# Patient Record
Sex: Male | Born: 1935 | Race: Black or African American | Hispanic: No | Marital: Married | Smoking: Current every day smoker
Health system: Southern US, Community
[De-identification: ages and names within clinical notes are randomized; demographics above are authoritative.]

## PROBLEM LIST (undated history)

## (undated) DIAGNOSIS — R001 Bradycardia, unspecified: Secondary | ICD-10-CM

## (undated) DIAGNOSIS — I5022 Chronic systolic (congestive) heart failure: Secondary | ICD-10-CM

## (undated) DIAGNOSIS — I10 Essential (primary) hypertension: Secondary | ICD-10-CM

## (undated) DIAGNOSIS — E876 Hypokalemia: Secondary | ICD-10-CM

## (undated) DIAGNOSIS — R410 Disorientation, unspecified: Secondary | ICD-10-CM

## (undated) DIAGNOSIS — I351 Nonrheumatic aortic (valve) insufficiency: Secondary | ICD-10-CM

## (undated) DIAGNOSIS — I071 Rheumatic tricuspid insufficiency: Secondary | ICD-10-CM

## (undated) DIAGNOSIS — W19XXXA Unspecified fall, initial encounter: Secondary | ICD-10-CM

## (undated) DIAGNOSIS — I340 Nonrheumatic mitral (valve) insufficiency: Secondary | ICD-10-CM

## (undated) DIAGNOSIS — Z720 Tobacco use: Secondary | ICD-10-CM

## (undated) DIAGNOSIS — I739 Peripheral vascular disease, unspecified: Secondary | ICD-10-CM

## (undated) DIAGNOSIS — R9430 Abnormal result of cardiovascular function study, unspecified: Secondary | ICD-10-CM

## (undated) DIAGNOSIS — I2510 Atherosclerotic heart disease of native coronary artery without angina pectoris: Secondary | ICD-10-CM

## (undated) DIAGNOSIS — R0602 Shortness of breath: Secondary | ICD-10-CM

## (undated) DIAGNOSIS — I4510 Unspecified right bundle-branch block: Secondary | ICD-10-CM

## (undated) DIAGNOSIS — E875 Hyperkalemia: Secondary | ICD-10-CM

## (undated) DIAGNOSIS — J449 Chronic obstructive pulmonary disease, unspecified: Secondary | ICD-10-CM

## (undated) DIAGNOSIS — IMO0002 Reserved for concepts with insufficient information to code with codable children: Secondary | ICD-10-CM

## (undated) DIAGNOSIS — N289 Disorder of kidney and ureter, unspecified: Secondary | ICD-10-CM

## (undated) DIAGNOSIS — R296 Repeated falls: Secondary | ICD-10-CM

## (undated) HISTORY — DX: Chronic systolic (congestive) heart failure: I50.22

## (undated) HISTORY — DX: Shortness of breath: R06.02

## (undated) HISTORY — DX: Hyperkalemia: E87.5

## (undated) HISTORY — DX: Hypokalemia: E87.6

## (undated) HISTORY — DX: Nonrheumatic mitral (valve) insufficiency: I34.0

## (undated) HISTORY — DX: Atherosclerotic heart disease of native coronary artery without angina pectoris: I25.10

## (undated) HISTORY — DX: Essential (primary) hypertension: I10

## (undated) HISTORY — DX: Disorientation, unspecified: R41.0

## (undated) HISTORY — DX: Peripheral vascular disease, unspecified: I73.9

## (undated) HISTORY — DX: Nonrheumatic aortic (valve) insufficiency: I35.1

## (undated) HISTORY — DX: Bradycardia, unspecified: R00.1

## (undated) HISTORY — DX: Unspecified fall, initial encounter: W19.XXXA

## (undated) HISTORY — DX: Disorder of kidney and ureter, unspecified: N28.9

## (undated) HISTORY — DX: Tobacco use: Z72.0

## (undated) HISTORY — DX: Unspecified right bundle-branch block: I45.10

## (undated) HISTORY — DX: Reserved for concepts with insufficient information to code with codable children: IMO0002

## (undated) HISTORY — DX: Rheumatic tricuspid insufficiency: I07.1

## (undated) HISTORY — DX: Chronic obstructive pulmonary disease, unspecified: J44.9

## (undated) HISTORY — DX: Repeated falls: R29.6

## (undated) HISTORY — DX: Abnormal result of cardiovascular function study, unspecified: R94.30

---

## 2006-04-11 ENCOUNTER — Ambulatory Visit: Payer: Self-pay | Admitting: Cardiology

## 2006-04-18 ENCOUNTER — Ambulatory Visit: Payer: Self-pay | Admitting: Cardiology

## 2006-05-10 ENCOUNTER — Ambulatory Visit: Payer: Self-pay | Admitting: Cardiology

## 2011-06-12 DIAGNOSIS — R0789 Other chest pain: Secondary | ICD-10-CM

## 2011-06-13 ENCOUNTER — Inpatient Hospital Stay (HOSPITAL_COMMUNITY)
Admission: AD | Admit: 2011-06-13 | Discharge: 2011-06-14 | DRG: 124 | Disposition: A | Discharge status: Home / Self Care | Payer: BC Managed Care – PPO | Source: Other Acute Inpatient Hospital | Attending: Cardiovascular Disease | Admitting: Cardiovascular Disease

## 2011-06-13 DIAGNOSIS — J4489 Other specified chronic obstructive pulmonary disease: Secondary | ICD-10-CM | POA: Diagnosis present

## 2011-06-13 DIAGNOSIS — Z7982 Long term (current) use of aspirin: Secondary | ICD-10-CM

## 2011-06-13 DIAGNOSIS — F17200 Nicotine dependence, unspecified, uncomplicated: Secondary | ICD-10-CM | POA: Diagnosis present

## 2011-06-13 DIAGNOSIS — I739 Peripheral vascular disease, unspecified: Secondary | ICD-10-CM | POA: Diagnosis present

## 2011-06-13 DIAGNOSIS — I509 Heart failure, unspecified: Secondary | ICD-10-CM | POA: Diagnosis present

## 2011-06-13 DIAGNOSIS — I10 Essential (primary) hypertension: Secondary | ICD-10-CM | POA: Diagnosis present

## 2011-06-13 DIAGNOSIS — I059 Rheumatic mitral valve disease, unspecified: Secondary | ICD-10-CM | POA: Diagnosis present

## 2011-06-13 DIAGNOSIS — J449 Chronic obstructive pulmonary disease, unspecified: Secondary | ICD-10-CM | POA: Diagnosis present

## 2011-06-13 DIAGNOSIS — E876 Hypokalemia: Secondary | ICD-10-CM | POA: Diagnosis present

## 2011-06-13 DIAGNOSIS — I2510 Atherosclerotic heart disease of native coronary artery without angina pectoris: Secondary | ICD-10-CM | POA: Diagnosis present

## 2011-06-13 DIAGNOSIS — I5023 Acute on chronic systolic (congestive) heart failure: Principal | ICD-10-CM | POA: Diagnosis present

## 2011-06-13 DIAGNOSIS — I428 Other cardiomyopathies: Secondary | ICD-10-CM | POA: Diagnosis present

## 2011-06-13 DIAGNOSIS — D649 Anemia, unspecified: Secondary | ICD-10-CM | POA: Diagnosis present

## 2011-06-13 LAB — BASIC METABOLIC PANEL
BUN: 14 mg/dL (ref 6–23)
CO2: 35 mEq/L — ABNORMAL HIGH (ref 19–32)
Calcium: 8.8 mg/dL (ref 8.4–10.5)
Chloride: 99 mEq/L (ref 96–112)
Creatinine, Ser: 0.83 mg/dL (ref 0.50–1.35)
GFR calc Af Amer: >60 mL/min (ref >60)

## 2011-06-14 DIAGNOSIS — I5023 Acute on chronic systolic (congestive) heart failure: Secondary | ICD-10-CM

## 2011-06-14 HISTORY — PX: CARDIAC CATHETERIZATION: SHX172

## 2011-06-14 LAB — BASIC METABOLIC PANEL
BUN: 15 mg/dL (ref 6–23)
Calcium: 8.4 mg/dL (ref 8.4–10.5)
GFR calc non Af Amer: >60 mL/min (ref >60)
Glucose, Bld: 94 mg/dL (ref 70–99)

## 2011-06-14 LAB — CBC
HCT: 33 % — ABNORMAL LOW (ref 39.0–52.0)
Hemoglobin: 11 g/dL — ABNORMAL LOW (ref 13.0–17.0)
MCH: 33.4 pg (ref 26.0–34.0)
MCHC: 33.3 g/dL (ref 30.0–36.0)
RDW: 14 % (ref 11.5–15.5)

## 2011-06-14 LAB — LIPID PANEL: LDL Cholesterol: 66 mg/dL (ref 0–99)

## 2011-06-21 NOTE — Cardiovascular Report (Signed)
  Jonathan Craig, MATERA NO.:  [id number]  MEDICAL RECORD NO.:  [id number]  LOCATION:  2915                         FACILITY:  [facility]  PHYSICIAN:  Noralyn Pick. Eden Emms, MD, FACCDATE OF BIRTH:  05-14-1924  DATE OF PROCEDURE: DATE OF DISCHARGE:                           CARDIAC CATHETERIZATION   INDICATIONS:  An 77 year old patient of Dr. Myrtis Ser, transferred from [facility] for heart failure.  A vague history accompanies the patient appears that he has had a chronic cardiomyopathy with no clearly documented coronary disease.  He had a mild enzyme bump at [facility] with a BMP of 20-22.  Cath was done to assess for coronary artery disease.  Cine catheterization was done from the right femoral artery who initially had difficulty due to severe calcification of his iliac artery as well as his femoral artery.  We were able to pass a 5-French sheath with a Wholey wire.  We tried to use a short J exchange throughout the case due to this.  Left main coronary artery had a 30% eccentric lesion.  Proximal LAD had a 20% eccentric lesion.  Mid and distal LAD were normal.  First diagonal branch had a 60-70% ostial lesion.  Her remainder of the vessel was normal.  The obtuse marginal branch was nondominant and normal.  First obtuse marginal branch was normal.  Right coronary artery was dominant and normal.  RAO ventriculography:  Ventriculography showed global hypokinesis with an EF of 25%.  There was at least mild MR.  Aortic pressure was 140/54, LV pressure was 140/14 with post A-wave EDP of 25.  Due to his decreased LV function, he was given 10 mg of IV Lasix in the lab.  IMPRESSION:  The patient appears to have a nonischemic cardiomyopathy, which by history is apparently chronic.  He is elderly with significant COPD and some asthenia.  Medical management is warranted.  He tolerated the procedure well.     Noralyn Pick. Eden Emms, MD, [facility]     PCN/MEDQ  D:  06/13/2011  T:   06/14/2011  Job:  [id number]  cc:   [facility] Gene Serpe, PA-C Luis Abed, MD, [facility]  Electronically Signed by Charlton Haws MD [facility] on 06/21/2011 12:53:41 PM

## 2011-06-22 DIAGNOSIS — I359 Nonrheumatic aortic valve disorder, unspecified: Secondary | ICD-10-CM

## 2011-07-04 NOTE — Discharge Summary (Signed)
NAMEKODA, ROUTON NO.:  [id number]  MEDICAL RECORD NO.:  [id number]  LOCATION:  2915                         FACILITY:  [facility]  PHYSICIAN:  Noralyn Pick. Eden Emms, MD, FACCDATE OF BIRTH:  07-Aug-1924  DATE OF ADMISSION:  06/13/2011 DATE OF DISCHARGE:  06/14/2011                              DISCHARGE SUMMARY   PROCEDURES: 1. Cardiac catheterization. 2. Coronary arteriogram. 3. Left ventriculogram.  PRIMARY/FINAL DISCHARGE DIAGNOSES:  Acute-on-chronic systolic congestive heart failure.  SECONDARY DIAGNOSES: 1. Nonischemic cardiomyopathy with nonobstructive coronary artery     disease by catheterization this admission. 2. Hypokalemia, status post supplementation. 3. Bilateral femoral bruits and abdominal bruits, felt secondary to     peripheral vascular disease because of difficulty accessing the     right femoral artery and calcium noted vascularly. 4. History of tobacco use. 5. Hypertension. 6. History of anemia with a hemoglobin of 11 and hematocrit of 33 at     discharge.  TIME AT DISCHARGE:  41 minutes.  HOSPITAL COURSE:  Mr. Bures is an 77 year old male with a history of systolic heart failure.  He had shortness of breath and went to [facility].  There, he was seen by Cardiology and an echocardiogram showed left ventricular dysfunction.  He was transferred to [facility] for further evaluation and cardiac catheterization.  Cardiac catheterization showed a 20% LAD, D1 70% ostially, and no other disease.  His EF was 25% with global hypokinesis and at least mild MR. He had a post-A wave EDP of 25, so he was given IV Lasix.  He was also felt to have some COPD and asthma.  Medical therapy was recommended.  He had been started on an ACE inhibitor and nitrates.  Prior to admission, he had been on low-dose Lasix and an ACE inhibitor.  On June 14, 2011, his potassium was slightly low, but other labs were stable. His potassium was supplemented.  A lipid  profile showed total cholesterol 139, triglycerides 115, HDL 50, LDL 66.  He was noted to have bilateral femoral bruits and an abdominal bruit.  Dr. Eden Emms felt that he had peripheral vascular disease because of calcified vessels and difficulty accessing the right femoral artery.  He is not currently having any claudication symptoms and is to follow up as an outpatient. On June 14, 2011, Mr. Goldston was evaluated by Dr. Eden Emms and considered stable for discharge, to follow up as an outpatient.  DISCHARGE INSTRUCTIONS: 1. His activity level is to be increased gradually. 2. He is not to do any lifting for a week but may shower. 3. He is encouraged to stick to a low-sodium, heart-healthy diet. 4. He is to call the office for problems with the cath site. 5. He is to follow up with Dr. Myrtis Ser on July 18, 2011, at 0930 and     with Dr. Virgina Organ as needed.  DISCHARGE MEDICATIONS: 1. Benazepril 10 mg daily. 2. Nicotine patch 14 mg daily for 2 weeks, then change to 7 mg daily     for 2 weeks, and stop. 3. Coreg 6.25 mg b.i.d. 4. Lasix 20 mg 1/2 tablet daily. 5. Imdur 30 mg daily. 6. Aspirin 81 mg daily. 7.  Flomax 0.4 mg daily. 8. Vitamin D2, 50,000 units on Friday and Saturday every week.     Theodore Demark, PA-C   ______________________________ Noralyn Pick. Eden Emms, MD, [facility]    RB/MEDQ  D:  06/14/2011  T:  06/15/2011  Job:  [id number]  cc:   Dr. Virgina Organ  Electronically Signed by Theodore Demark PA-C on 06/28/2011 04:10:49 PM Electronically Signed by Charlton Haws MD [facility] on 07/04/2011 10:19:52 PM

## 2011-07-15 ENCOUNTER — Encounter: Payer: Self-pay | Admitting: *Deleted

## 2011-07-17 ENCOUNTER — Encounter: Payer: Self-pay | Admitting: Cardiology

## 2011-07-17 DIAGNOSIS — I10 Essential (primary) hypertension: Secondary | ICD-10-CM | POA: Insufficient documentation

## 2011-07-17 DIAGNOSIS — J449 Chronic obstructive pulmonary disease, unspecified: Secondary | ICD-10-CM | POA: Insufficient documentation

## 2011-07-17 DIAGNOSIS — E876 Hypokalemia: Secondary | ICD-10-CM | POA: Insufficient documentation

## 2011-07-17 DIAGNOSIS — R0602 Shortness of breath: Secondary | ICD-10-CM | POA: Insufficient documentation

## 2011-07-17 DIAGNOSIS — I2510 Atherosclerotic heart disease of native coronary artery without angina pectoris: Secondary | ICD-10-CM | POA: Insufficient documentation

## 2011-07-17 DIAGNOSIS — R9430 Abnormal result of cardiovascular function study, unspecified: Secondary | ICD-10-CM | POA: Insufficient documentation

## 2011-07-17 DIAGNOSIS — I739 Peripheral vascular disease, unspecified: Secondary | ICD-10-CM | POA: Insufficient documentation

## 2011-07-17 DIAGNOSIS — Z720 Tobacco use: Secondary | ICD-10-CM | POA: Insufficient documentation

## 2011-07-18 ENCOUNTER — Encounter: Payer: Self-pay | Admitting: Cardiology

## 2011-07-18 ENCOUNTER — Encounter: Payer: BC Managed Care – PPO | Admitting: Cardiology

## 2011-08-05 DIAGNOSIS — I509 Heart failure, unspecified: Secondary | ICD-10-CM

## 2011-08-05 DIAGNOSIS — I200 Unstable angina: Secondary | ICD-10-CM

## 2011-08-18 DIAGNOSIS — R7989 Other specified abnormal findings of blood chemistry: Secondary | ICD-10-CM

## 2011-08-18 DIAGNOSIS — I5023 Acute on chronic systolic (congestive) heart failure: Secondary | ICD-10-CM

## 2011-08-25 ENCOUNTER — Encounter: Payer: Self-pay | Admitting: Cardiology

## 2011-08-26 ENCOUNTER — Ambulatory Visit (INDEPENDENT_AMBULATORY_CARE_PROVIDER_SITE_OTHER): Payer: BC Managed Care – PPO | Admitting: Cardiology

## 2011-08-26 ENCOUNTER — Encounter: Payer: Self-pay | Admitting: Cardiology

## 2011-08-26 DIAGNOSIS — Z720 Tobacco use: Secondary | ICD-10-CM

## 2011-08-26 DIAGNOSIS — I428 Other cardiomyopathies: Secondary | ICD-10-CM

## 2011-08-26 DIAGNOSIS — F17200 Nicotine dependence, unspecified, uncomplicated: Secondary | ICD-10-CM

## 2011-08-26 DIAGNOSIS — I509 Heart failure, unspecified: Secondary | ICD-10-CM

## 2011-08-26 DIAGNOSIS — E876 Hypokalemia: Secondary | ICD-10-CM

## 2011-08-26 DIAGNOSIS — I2510 Atherosclerotic heart disease of native coronary artery without angina pectoris: Secondary | ICD-10-CM

## 2011-08-26 MED ORDER — BENAZEPRIL HCL 20 MG PO TABS: 20.0000 mg | ORAL_TABLET | Freq: Every day | ORAL | Status: DC

## 2011-08-26 NOTE — Assessment & Plan Note (Signed)
Coronary disease is stable. No change in therapy. 

## 2011-08-26 NOTE — Assessment & Plan Note (Signed)
The patient was recently admitted and treated for fluid overload.  His chemistry will be checked today or tomorrow to be sure that his potassium is okay.

## 2011-08-26 NOTE — Assessment & Plan Note (Signed)
The patient had an episode of acute on chronic systolic heart failure and was treated at the hospital.  He is stable now and his volume status is stable.  No change in therapy.

## 2011-08-26 NOTE — Assessment & Plan Note (Signed)
The patient has a cardiomyopathy that is felt to be primarily nonischemic.  He does have coronary disease.  He is on aspirin, ACE inhibitor, beta blocker, diuretic, Nitrate, and potassium. I have chosen not to add spironolactone yet.  His blood pressure is elevated and his benazepril dose will be increased.

## 2011-08-26 NOTE — Assessment & Plan Note (Signed)
Patient continues to smoke.  He is counseled to stop.
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
## 2011-08-26 NOTE — Patient Instructions (Signed)
   Increase Benazepril to 20mg  daily Your physician recommends that you go to the [facility] for lab work for [employer]. If the results of your test are normal or stable, you will receive a letter.  If they are abnormal, the nurse will contact you by phone Follow up in  3 months - see above.

## 2011-09-30 ENCOUNTER — Encounter: Payer: Self-pay | Admitting: *Deleted

## 2011-10-28 ENCOUNTER — Ambulatory Visit: Payer: BC Managed Care – PPO | Admitting: Cardiology

## 2011-10-28 ENCOUNTER — Encounter: Payer: Self-pay | Admitting: Cardiology

## 2011-11-21 ENCOUNTER — Ambulatory Visit: Payer: BC Managed Care – PPO | Admitting: Cardiology

## 2011-12-19 ENCOUNTER — Ambulatory Visit (INDEPENDENT_AMBULATORY_CARE_PROVIDER_SITE_OTHER): Payer: BC Managed Care – PPO | Admitting: Cardiology

## 2011-12-19 ENCOUNTER — Encounter: Payer: Self-pay | Admitting: Cardiology

## 2011-12-19 DIAGNOSIS — I428 Other cardiomyopathies: Secondary | ICD-10-CM

## 2011-12-19 DIAGNOSIS — I509 Heart failure, unspecified: Secondary | ICD-10-CM

## 2011-12-19 DIAGNOSIS — F17200 Nicotine dependence, unspecified, uncomplicated: Secondary | ICD-10-CM

## 2011-12-19 DIAGNOSIS — Z720 Tobacco use: Secondary | ICD-10-CM

## 2011-12-19 NOTE — Patient Instructions (Signed)
Follow up as scheduled. Increase Lasix (furosemide) to 40 mg daily for 3 days and then resume 20 mg daily.
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
## 2011-12-19 NOTE — Assessment & Plan Note (Signed)
Patient continues to smoke. I've encouraged him to stop.

## 2011-12-19 NOTE — Assessment & Plan Note (Signed)
Patient is on appropriate medications. I've chosen not to use spironolactone.

## 2011-12-19 NOTE — Assessment & Plan Note (Signed)
The patient does have mild edema. We will increase his diuretic to 40 mg daily for 3 days and then cut it back to 20. Of and see him in followup.

## 2012-01-30 ENCOUNTER — Telehealth: Payer: Self-pay | Admitting: *Deleted

## 2012-01-30 NOTE — Telephone Encounter (Signed)
Pt's caregiver, Kara Mead, states pt is having a lot of leg swelling and some SOB. His dgt, [name], asked Kara Mead to call to see if pt can be seen in the office. Pt has appt scheduled for 3/8. Dr. Myrtis Ser and Gene's first available appts are 3/5. Kara Mead will keep appt for 3/8. She is aware pt should go to ER for worsening SOB and/or swelling.

## 2012-02-13 ENCOUNTER — Encounter: Payer: Self-pay | Admitting: Cardiology

## 2012-02-13 ENCOUNTER — Telehealth: Payer: Self-pay | Admitting: *Deleted

## 2012-02-13 DIAGNOSIS — I509 Heart failure, unspecified: Secondary | ICD-10-CM

## 2012-02-13 NOTE — Telephone Encounter (Signed)
Angie left a message on voicemail asking for a return call regarding fluid building up.

## 2012-02-13 NOTE — Telephone Encounter (Signed)
Spoke with Angie. She states pt is going to see Dr. Virgina Organ today at 11. She will keep appt with Dr. Myrtis Ser for Friday.

## 2012-02-14 DIAGNOSIS — I5023 Acute on chronic systolic (congestive) heart failure: Secondary | ICD-10-CM

## 2012-02-17 ENCOUNTER — Encounter: Payer: Self-pay | Admitting: Cardiology

## 2012-02-17 ENCOUNTER — Ambulatory Visit (INDEPENDENT_AMBULATORY_CARE_PROVIDER_SITE_OTHER): Payer: Medicare Other | Admitting: Physician Assistant

## 2012-02-17 ENCOUNTER — Encounter: Payer: Medicare Other | Admitting: Cardiology

## 2012-02-17 DIAGNOSIS — N289 Disorder of kidney and ureter, unspecified: Secondary | ICD-10-CM

## 2012-02-17 DIAGNOSIS — E875 Hyperkalemia: Secondary | ICD-10-CM | POA: Insufficient documentation

## 2012-02-17 DIAGNOSIS — I10 Essential (primary) hypertension: Secondary | ICD-10-CM

## 2012-02-17 DIAGNOSIS — R0602 Shortness of breath: Secondary | ICD-10-CM

## 2012-02-17 DIAGNOSIS — I509 Heart failure, unspecified: Secondary | ICD-10-CM

## 2012-02-17 MED ORDER — CARVEDILOL 6.25 MG PO TABS: 6.2500 mg | ORAL_TABLET | Freq: Two times a day (BID) | ORAL | Status: DC

## 2012-02-17 MED ORDER — FUROSEMIDE 40 MG PO TABS: 40.0000 mg | ORAL_TABLET | Freq: Every day | ORAL | Status: DC

## 2012-02-17 MED ORDER — BENAZEPRIL HCL 10 MG PO TABS: 10.0000 mg | ORAL_TABLET | Freq: Every day | ORAL | Status: DC

## 2012-02-17 NOTE — Patient Instructions (Signed)
   Follow up in 1 month. Your physician recommends that you go to the [facility] for lab work: BMET/BNP. DO LAB IN 1 WEEK Take medications as listed on list given today. Prescription/Refills for Coreg (carvedilol) 6.25 mg two times a day, Lasix (furosemide) 40 mg daily and Lotensin 10 mg daily (you may take 1/2 of your 20 mg tablets if you have any of these remaining) were sent to your pharmacy [facility]).  No salt added diet. See additional instructions.

## 2012-02-17 NOTE — Assessment & Plan Note (Signed)
Continue treatment with carvedilol and benazepril
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
## 2012-02-17 NOTE — Assessment & Plan Note (Signed)
Patient had rise in potassium level to 5.5 on Aldactone. Therefore, we discontinued this, and recommend that he not be rechallenged with this medication. Will check followup labs.

## 2012-02-17 NOTE — Assessment & Plan Note (Signed)
Stable by followup labs yesterday, with creatinine level of 1.2. Will reassess with followup labs next week.

## 2012-02-17 NOTE — Assessment & Plan Note (Signed)
Currently euvolemic, following recent hospitalization for mild volume overload and A./C. systolic heart failure. Continue current diuretic regimen of Lasix 40 mg daily. Patient is to NOT be placed back on Aldactone, secondary to hyperkalemia. Will check followup BMET/BNP level in one week. Schedule early clinic followup with myself/Dr. Myrtis Ser in one month, for continued close monitoring.

## 2012-03-21 ENCOUNTER — Encounter: Payer: Self-pay | Admitting: Physician Assistant

## 2012-03-21 ENCOUNTER — Ambulatory Visit (INDEPENDENT_AMBULATORY_CARE_PROVIDER_SITE_OTHER): Payer: Medicare Other | Admitting: Physician Assistant

## 2012-03-21 VITALS — BP 113/61 | HR 65 | Ht 73.0 in | Wt 167.0 lb

## 2012-03-21 DIAGNOSIS — E875 Hyperkalemia: Secondary | ICD-10-CM

## 2012-03-21 DIAGNOSIS — R0602 Shortness of breath: Secondary | ICD-10-CM

## 2012-03-21 DIAGNOSIS — I10 Essential (primary) hypertension: Secondary | ICD-10-CM

## 2012-03-21 DIAGNOSIS — N289 Disorder of kidney and ureter, unspecified: Secondary | ICD-10-CM

## 2012-03-21 DIAGNOSIS — I5022 Chronic systolic (congestive) heart failure: Secondary | ICD-10-CM

## 2012-03-21 DIAGNOSIS — I509 Heart failure, unspecified: Secondary | ICD-10-CM

## 2012-03-21 NOTE — Assessment & Plan Note (Signed)
Patient has gained 11 pounds since last OV. Have requested most recent labs from Dr. Waynard Reeds office. Additionally, we will repeat BMPT/BNP level today. We'll continue current Lasix dose 40 mg daily, pending review of lab results. Once again, patient will be instructed to DC Aldactone (documented hyperkalemia). He is also to weigh himself daily, and to contact our office if his weight increases 3 pounds/24 hours. He is to refrain from adding salt to his food.
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
## 2012-03-21 NOTE — Assessment & Plan Note (Signed)
Will reassess with followup labs today.

## 2012-03-21 NOTE — Assessment & Plan Note (Signed)
Will monitor closely with followup labs today

## 2012-03-21 NOTE — Patient Instructions (Signed)
Follow up in 2 months.  Stop Aldactone (spironolactone) Your physician recommends that you go to the [facility] for lab work: BMET/BNP. DO LAB TODAY! No added salt diet. (See attached.) Weigh daily and record these readings. Call the office if your weight increases more than 3 lbs in 24 hours. Do not take any supplemental potassium.

## 2012-03-21 NOTE — Assessment & Plan Note (Signed)
Very well controlled on current regimen

## 2012-03-26 ENCOUNTER — Encounter: Payer: Self-pay | Admitting: *Deleted

## 2012-04-05 DIAGNOSIS — I509 Heart failure, unspecified: Secondary | ICD-10-CM

## 2012-05-24 ENCOUNTER — Encounter: Payer: Self-pay | Admitting: Cardiology

## 2012-05-24 ENCOUNTER — Ambulatory Visit (INDEPENDENT_AMBULATORY_CARE_PROVIDER_SITE_OTHER): Payer: Medicare Other | Admitting: Cardiology

## 2012-05-24 VITALS — BP 103/54 | HR 58 | Ht 73.0 in | Wt 173.8 lb

## 2012-05-24 DIAGNOSIS — I2510 Atherosclerotic heart disease of native coronary artery without angina pectoris: Secondary | ICD-10-CM

## 2012-05-24 DIAGNOSIS — R0602 Shortness of breath: Secondary | ICD-10-CM

## 2012-05-24 DIAGNOSIS — I10 Essential (primary) hypertension: Secondary | ICD-10-CM

## 2012-05-24 DIAGNOSIS — I4510 Unspecified right bundle-branch block: Secondary | ICD-10-CM

## 2012-05-24 DIAGNOSIS — W19XXXA Unspecified fall, initial encounter: Secondary | ICD-10-CM

## 2012-05-24 DIAGNOSIS — R001 Bradycardia, unspecified: Secondary | ICD-10-CM | POA: Insufficient documentation

## 2012-05-24 DIAGNOSIS — E875 Hyperkalemia: Secondary | ICD-10-CM

## 2012-05-24 DIAGNOSIS — I509 Heart failure, unspecified: Secondary | ICD-10-CM

## 2012-05-24 DIAGNOSIS — I498 Other specified cardiac arrhythmias: Secondary | ICD-10-CM

## 2012-05-24 NOTE — Assessment & Plan Note (Signed)
Blood pressure is controlled. No change in therapy. 

## 2012-05-24 NOTE — Assessment & Plan Note (Signed)
The patient had labs drawn in April, 2013. Renal function was stable and potassium is stable. He does not need labs today.

## 2012-05-24 NOTE — Assessment & Plan Note (Signed)
The patient has had some falls. These are not syncopal episodes. This is probably related to his age and overall status.

## 2012-05-24 NOTE — Assessment & Plan Note (Signed)
The patient does have edema today. His diuretic will be increased. Also he will be cutting back his fluid intake.

## 2012-05-24 NOTE — Assessment & Plan Note (Signed)
Right bundle-branch block is documented. There is no old 12-lead for me to review today. No further workup.

## 2012-05-24 NOTE — Assessment & Plan Note (Signed)
There is mild bradycardia today. The patient is on carvedilol. He has been on a good dose for his LV dysfunction. He has no symptoms of bradycardia. No change in therapy. I will see him back soon to followup his edema.

## 2012-05-24 NOTE — Assessment & Plan Note (Signed)
Coronary disease is stable. No change in therapy. 
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
## 2012-05-24 NOTE — Assessment & Plan Note (Signed)
Patient is not having any significant shortness of breath at this time. However he is fluid overloaded. His diuretics will be increased.

## 2012-05-24 NOTE — Patient Instructions (Signed)
   Lasix 40mg  twice a day x 3 days only, then back to regular daily dose   Fluids in moderation Follow up in  3 weeks

## 2012-05-28 DIAGNOSIS — I509 Heart failure, unspecified: Secondary | ICD-10-CM

## 2012-05-29 DIAGNOSIS — I5023 Acute on chronic systolic (congestive) heart failure: Secondary | ICD-10-CM

## 2012-06-22 ENCOUNTER — Encounter: Payer: Self-pay | Admitting: Cardiology

## 2012-06-22 ENCOUNTER — Other Ambulatory Visit: Payer: Self-pay | Admitting: *Deleted

## 2012-06-22 ENCOUNTER — Ambulatory Visit (INDEPENDENT_AMBULATORY_CARE_PROVIDER_SITE_OTHER): Payer: BC Managed Care – PPO | Admitting: Cardiology

## 2012-06-22 VITALS — BP 99/58 | HR 70 | Ht 73.0 in | Wt 141.0 lb

## 2012-06-22 DIAGNOSIS — I10 Essential (primary) hypertension: Secondary | ICD-10-CM

## 2012-06-22 DIAGNOSIS — I5022 Chronic systolic (congestive) heart failure: Secondary | ICD-10-CM

## 2012-06-22 DIAGNOSIS — R0989 Other specified symptoms and signs involving the circulatory and respiratory systems: Secondary | ICD-10-CM

## 2012-06-22 DIAGNOSIS — I509 Heart failure, unspecified: Secondary | ICD-10-CM

## 2012-06-22 DIAGNOSIS — I340 Nonrheumatic mitral (valve) insufficiency: Secondary | ICD-10-CM

## 2012-06-22 DIAGNOSIS — J449 Chronic obstructive pulmonary disease, unspecified: Secondary | ICD-10-CM

## 2012-06-22 DIAGNOSIS — R9430 Abnormal result of cardiovascular function study, unspecified: Secondary | ICD-10-CM

## 2012-06-22 DIAGNOSIS — I059 Rheumatic mitral valve disease, unspecified: Secondary | ICD-10-CM

## 2012-06-22 NOTE — Assessment & Plan Note (Signed)
The patient is on all the appropriate medications and we will check his chemistry today.

## 2012-06-22 NOTE — Patient Instructions (Addendum)
Your physician recommends that you schedule a follow-up appointment in: 6-7 weeks with Dr. Myrtis Ser.  Your physician recommends that you continue on your current medications as directed. Please refer to the Current Medication list given to you today.    Your physician recommends that you return for lab work in: today at [facility] for [employer].

## 2012-06-22 NOTE — Assessment & Plan Note (Signed)
The patient was hospitalized in May 27, 2012. He was significantly fluid overloaded. Since that time he's diaries a total of 30 pounds. He is actually feeling well. Today he asked me if he would be a candidate for heart transplant. I explained to him that he would not be a candidate at age 78. I told him I would talk with our heart failure team to be sure that his age was an absolute contraindication. He says that some of his family members have lived to the age 44. At this time he does not have significant symptoms when his fluid is controlled. We do need to check his chemistry.
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
## 2012-06-22 NOTE — Assessment & Plan Note (Signed)
Blood pressure is no longer high. In fact it is on the low side but he is not having any symptoms. No change in therapy.

## 2012-06-22 NOTE — Assessment & Plan Note (Signed)
The patient is getting pulmonary treatments at home.

## 2012-06-22 NOTE — Assessment & Plan Note (Signed)
The patient has moderate mitral regurgitation but it is not severe.

## 2012-07-12 ENCOUNTER — Telehealth: Payer: Self-pay | Admitting: *Deleted

## 2012-07-12 NOTE — Telephone Encounter (Signed)
Message copied by Lesle Chris on Thu Jul 12, 2012  2:49 PM ------      Message from: Myrtis Ser, [place] D      Created: Thu Jul 12, 2012  9:51 AM       Lab is good

## 2012-07-12 NOTE — Telephone Encounter (Signed)
Notes Recorded by Lesle Chris, LPN on 12/17/1094 at 1:18 PM Angelica Belcher - caretaker, notified of below. Notes Recorded by Lesle Chris, LPN on 0/03/5408 at 10:04 AM Left message to return call.

## 2012-08-08 ENCOUNTER — Ambulatory Visit: Payer: BC Managed Care – PPO | Admitting: Cardiology

## 2012-08-15 DIAGNOSIS — R748 Abnormal levels of other serum enzymes: Secondary | ICD-10-CM

## 2012-08-16 ENCOUNTER — Inpatient Hospital Stay (HOSPITAL_COMMUNITY)
Admission: EM | Admit: 2012-08-16 | Discharge: 2012-08-24 | DRG: 882 | Disposition: A | Discharge status: Home / Self Care | Payer: BC Managed Care – PPO | Source: Other Acute Inpatient Hospital | Attending: Cardiology | Admitting: Cardiology

## 2012-08-16 ENCOUNTER — Inpatient Hospital Stay (HOSPITAL_COMMUNITY): Payer: BC Managed Care – PPO

## 2012-08-16 ENCOUNTER — Other Ambulatory Visit: Payer: Self-pay | Admitting: Physician Assistant

## 2012-08-16 DIAGNOSIS — E46 Unspecified protein-calorie malnutrition: Secondary | ICD-10-CM | POA: Diagnosis present

## 2012-08-16 DIAGNOSIS — G9341 Metabolic encephalopathy: Secondary | ICD-10-CM | POA: Diagnosis present

## 2012-08-16 DIAGNOSIS — I129 Hypertensive chronic kidney disease with stage 1 through stage 4 chronic kidney disease, or unspecified chronic kidney disease: Secondary | ICD-10-CM | POA: Diagnosis present

## 2012-08-16 DIAGNOSIS — R0902 Hypoxemia: Secondary | ICD-10-CM | POA: Diagnosis present

## 2012-08-16 DIAGNOSIS — N289 Disorder of kidney and ureter, unspecified: Secondary | ICD-10-CM

## 2012-08-16 DIAGNOSIS — R9431 Abnormal electrocardiogram [ECG] [EKG]: Secondary | ICD-10-CM | POA: Diagnosis present

## 2012-08-16 DIAGNOSIS — I214 Non-ST elevation (NSTEMI) myocardial infarction: Secondary | ICD-10-CM

## 2012-08-16 DIAGNOSIS — I509 Heart failure, unspecified: Secondary | ICD-10-CM

## 2012-08-16 DIAGNOSIS — I080 Rheumatic disorders of both mitral and aortic valves: Secondary | ICD-10-CM | POA: Diagnosis present

## 2012-08-16 DIAGNOSIS — J9600 Acute respiratory failure, unspecified whether with hypoxia or hypercapnia: Principal | ICD-10-CM

## 2012-08-16 DIAGNOSIS — R570 Cardiogenic shock: Secondary | ICD-10-CM | POA: Diagnosis present

## 2012-08-16 DIAGNOSIS — D696 Thrombocytopenia, unspecified: Secondary | ICD-10-CM | POA: Diagnosis not present

## 2012-08-16 DIAGNOSIS — J449 Chronic obstructive pulmonary disease, unspecified: Secondary | ICD-10-CM | POA: Diagnosis present

## 2012-08-16 DIAGNOSIS — F17200 Nicotine dependence, unspecified, uncomplicated: Secondary | ICD-10-CM | POA: Diagnosis present

## 2012-08-16 DIAGNOSIS — I5022 Chronic systolic (congestive) heart failure: Secondary | ICD-10-CM

## 2012-08-16 DIAGNOSIS — D6489 Other specified anemias: Secondary | ICD-10-CM | POA: Diagnosis present

## 2012-08-16 DIAGNOSIS — R5381 Other malaise: Secondary | ICD-10-CM

## 2012-08-16 DIAGNOSIS — J4489 Other specified chronic obstructive pulmonary disease: Secondary | ICD-10-CM | POA: Diagnosis present

## 2012-08-16 DIAGNOSIS — J9601 Acute respiratory failure with hypoxia: Secondary | ICD-10-CM | POA: Diagnosis present

## 2012-08-16 DIAGNOSIS — I5023 Acute on chronic systolic (congestive) heart failure: Secondary | ICD-10-CM | POA: Diagnosis present

## 2012-08-16 DIAGNOSIS — I2510 Atherosclerotic heart disease of native coronary artery without angina pectoris: Secondary | ICD-10-CM | POA: Diagnosis present

## 2012-08-16 DIAGNOSIS — J189 Pneumonia, unspecified organism: Secondary | ICD-10-CM | POA: Diagnosis present

## 2012-08-16 DIAGNOSIS — I4510 Unspecified right bundle-branch block: Secondary | ICD-10-CM | POA: Diagnosis present

## 2012-08-16 DIAGNOSIS — Z79899 Other long term (current) drug therapy: Secondary | ICD-10-CM

## 2012-08-16 DIAGNOSIS — N183 Chronic kidney disease, stage 3 unspecified: Secondary | ICD-10-CM | POA: Diagnosis present

## 2012-08-16 DIAGNOSIS — R4182 Altered mental status, unspecified: Secondary | ICD-10-CM | POA: Diagnosis present

## 2012-08-16 DIAGNOSIS — E876 Hypokalemia: Secondary | ICD-10-CM | POA: Diagnosis present

## 2012-08-16 DIAGNOSIS — I079 Rheumatic tricuspid valve disease, unspecified: Secondary | ICD-10-CM | POA: Diagnosis present

## 2012-08-16 DIAGNOSIS — Z7982 Long term (current) use of aspirin: Secondary | ICD-10-CM

## 2012-08-16 DIAGNOSIS — E871 Hypo-osmolality and hyponatremia: Secondary | ICD-10-CM | POA: Diagnosis not present

## 2012-08-16 DIAGNOSIS — Z803 Family history of malignant neoplasm of breast: Secondary | ICD-10-CM

## 2012-08-16 DIAGNOSIS — I2789 Other specified pulmonary heart diseases: Secondary | ICD-10-CM | POA: Diagnosis present

## 2012-08-16 DIAGNOSIS — Z836 Family history of other diseases of the respiratory system: Secondary | ICD-10-CM

## 2012-08-16 DIAGNOSIS — I70209 Unspecified atherosclerosis of native arteries of extremities, unspecified extremity: Secondary | ICD-10-CM | POA: Diagnosis present

## 2012-08-16 DIAGNOSIS — Z9181 History of falling: Secondary | ICD-10-CM

## 2012-08-16 DIAGNOSIS — I428 Other cardiomyopathies: Secondary | ICD-10-CM | POA: Diagnosis present

## 2012-08-16 DIAGNOSIS — T148XXA Other injury of unspecified body region, initial encounter: Secondary | ICD-10-CM | POA: Diagnosis not present

## 2012-08-16 DIAGNOSIS — Z8249 Family history of ischemic heart disease and other diseases of the circulatory system: Secondary | ICD-10-CM

## 2012-08-16 LAB — COMPREHENSIVE METABOLIC PANEL
CO2: 28 mEq/L (ref 19–32)
Calcium: 9.5 mg/dL (ref 8.4–10.5)
Creatinine, Ser: 1.02 mg/dL (ref 0.50–1.35)
GFR calc Af Amer: 75 mL/min — ABNORMAL LOW (ref >90)
GFR calc non Af Amer: 65 mL/min — ABNORMAL LOW (ref >90)
Glucose, Bld: 170 mg/dL — ABNORMAL HIGH (ref 70–99)

## 2012-08-16 LAB — PROCALCITONIN: Procalcitonin: 0.33 ng/mL

## 2012-08-16 LAB — GLUCOSE, CAPILLARY
Glucose-Capillary: 170 mg/dL — ABNORMAL HIGH (ref 70–99)
Glucose-Capillary: 137 mg/dL — ABNORMAL HIGH (ref 70–99)

## 2012-08-16 LAB — PHOSPHORUS: Phosphorus: 2.4 mg/dL (ref 2.3–4.6)

## 2012-08-16 LAB — CBC
MCH: 32.2 pg (ref 26.0–34.0)
MCV: 95 fL (ref 78.0–100.0)
Platelets: 109 10*3/uL — ABNORMAL LOW (ref 150–400)
RDW: 20 % — ABNORMAL HIGH (ref 11.5–15.5)

## 2012-08-16 LAB — POCT I-STAT 3, ART BLOOD GAS (G3+)
Acid-Base Excess: 6 mmol/L — ABNORMAL HIGH (ref 0.0–2.0)
Bicarbonate: 28.3 mEq/L — ABNORMAL HIGH (ref 20.0–24.0)
Patient temperature: 98.6
pH, Arterial: 7.542 — ABNORMAL HIGH (ref 7.350–7.450)

## 2012-08-16 LAB — MAGNESIUM: Magnesium: 2.2 mg/dL (ref 1.5–2.5)

## 2012-08-16 MED ORDER — METHYLPREDNISOLONE SODIUM SUCC 40 MG IJ SOLR
40.0000 mg | Freq: Two times a day (BID) | INTRAMUSCULAR | Status: DC
  Filled 2012-08-16: qty 1

## 2012-08-16 MED ORDER — SODIUM CHLORIDE 0.9 % IV SOLN
250.0000 mL | INTRAVENOUS | Status: DC | PRN
  Administered 2012-08-16: 1000 mL via INTRAVENOUS

## 2012-08-16 MED ORDER — DEXTROSE 5 % IV SOLN
1.0000 g | INTRAVENOUS | Status: AC
  Administered 2012-08-16 – 2012-08-20 (×5): 1 g via INTRAVENOUS
  Filled 2012-08-16 (×5): qty 10

## 2012-08-16 MED ORDER — VANCOMYCIN HCL 1000 MG IV SOLR: 750.0000 mg | INTRAVENOUS | Status: DC

## 2012-08-16 MED ORDER — METHYLPREDNISOLONE SODIUM SUCC 125 MG IJ SOLR: 60.0000 mg | Freq: Three times a day (TID) | INTRAMUSCULAR | Status: DC

## 2012-08-16 MED ORDER — METHYLPREDNISOLONE SODIUM SUCC 40 MG IJ SOLR
40.0000 mg | Freq: Two times a day (BID) | INTRAMUSCULAR | Status: DC
  Administered 2012-08-16 – 2012-08-18 (×4): 40 mg via INTRAVENOUS
  Filled 2012-08-16 (×6): qty 1

## 2012-08-16 MED ORDER — SODIUM CHLORIDE 0.9 % IV SOLN
250.0000 mL | INTRAVENOUS | Status: DC | PRN
  Administered 2012-08-16: 250 mL via INTRAVENOUS

## 2012-08-16 MED ORDER — FUROSEMIDE 40 MG PO TABS
40.0000 mg | ORAL_TABLET | Freq: Every day | ORAL | Status: DC
  Administered 2012-08-16: 40 mg via ORAL
  Filled 2012-08-16 (×2): qty 1

## 2012-08-16 MED ORDER — LORAZEPAM 2 MG/ML IJ SOLN: 2.0000 mg | INTRAMUSCULAR | Status: DC | PRN

## 2012-08-16 MED ORDER — BIOTENE DRY MOUTH MT LIQD
15.0000 mL | Freq: Two times a day (BID) | OROMUCOSAL | Status: DC
  Administered 2012-08-17 (×2): 15 mL via OROMUCOSAL

## 2012-08-16 MED ORDER — ALBUTEROL SULFATE (5 MG/ML) 0.5% IN NEBU
2.5000 mg | INHALATION_SOLUTION | RESPIRATORY_TRACT | Status: DC
  Filled 2012-08-16: qty 0.5

## 2012-08-16 MED ORDER — VANCOMYCIN HCL IN DEXTROSE 1-5 GM/200ML-% IV SOLN
1000.0000 mg | Freq: Once | INTRAVENOUS | Status: DC
  Filled 2012-08-16: qty 200

## 2012-08-16 MED ORDER — IPRATROPIUM BROMIDE 0.02 % IN SOLN
0.5000 mg | RESPIRATORY_TRACT | Status: DC
  Filled 2012-08-16: qty 2.5

## 2012-08-16 MED ORDER — SODIUM CHLORIDE 0.9 % IJ SOLN: 3.0000 mL | INTRAMUSCULAR | Status: DC | PRN

## 2012-08-16 MED ORDER — ASPIRIN EC 325 MG PO TBEC
325.0000 mg | DELAYED_RELEASE_TABLET | Freq: Every day | ORAL | Status: DC
  Administered 2012-08-17: 325 mg via ORAL
  Filled 2012-08-16 (×2): qty 1

## 2012-08-16 MED ORDER — PANTOPRAZOLE SODIUM 40 MG PO TBEC: 40.0000 mg | DELAYED_RELEASE_TABLET | Freq: Every day | ORAL | Status: DC

## 2012-08-16 MED ORDER — INSULIN ASPART 100 UNIT/ML ~~LOC~~ SOLN
0.0000 [IU] | SUBCUTANEOUS | Status: DC
  Administered 2012-08-16: 2 [IU] via SUBCUTANEOUS
  Administered 2012-08-16: 3 [IU] via SUBCUTANEOUS
  Administered 2012-08-16 – 2012-08-18 (×8): 2 [IU] via SUBCUTANEOUS
  Administered 2012-08-18: 3 [IU] via SUBCUTANEOUS

## 2012-08-16 MED ORDER — HEPARIN (PORCINE) IN NACL 100-0.45 UNIT/ML-% IJ SOLN
1050.0000 [IU]/h | INTRAMUSCULAR | Status: DC
  Administered 2012-08-16: 800 [IU]/h via INTRAVENOUS
  Administered 2012-08-17: 1050 [IU]/h via INTRAVENOUS
  Filled 2012-08-16 (×2): qty 250

## 2012-08-16 MED ORDER — NITROGLYCERIN 0.4 MG SL SUBL: 0.4000 mg | SUBLINGUAL_TABLET | SUBLINGUAL | Status: DC | PRN

## 2012-08-16 MED ORDER — CHLORHEXIDINE GLUCONATE 0.12 % MT SOLN
15.0000 mL | Freq: Two times a day (BID) | OROMUCOSAL | Status: DC
  Administered 2012-08-16 – 2012-08-18 (×4): 15 mL via OROMUCOSAL
  Filled 2012-08-16 (×4): qty 15

## 2012-08-16 MED ORDER — DOBUTAMINE IN D5W 4-5 MG/ML-% IV SOLN
2.5000 ug/kg/min | INTRAVENOUS | Status: DC
  Administered 2012-08-16: 2.5 ug/kg/min via INTRAVENOUS
  Filled 2012-08-16: qty 250

## 2012-08-16 MED ORDER — PANTOPRAZOLE SODIUM 40 MG PO PACK
40.0000 mg | PACK | Freq: Every day | ORAL | Status: DC
  Administered 2012-08-16 – 2012-08-17 (×2): 40 mg
  Filled 2012-08-16 (×3): qty 20

## 2012-08-16 MED ORDER — ACETAMINOPHEN 325 MG PO TABS: 650.0000 mg | ORAL_TABLET | ORAL | Status: DC | PRN

## 2012-08-16 MED ORDER — DEXTROSE 5 % IV SOLN
500.0000 mg | Freq: Every day | INTRAVENOUS | Status: AC
  Administered 2012-08-16 – 2012-08-20 (×5): 500 mg via INTRAVENOUS
  Filled 2012-08-16 (×5): qty 500

## 2012-08-16 MED ORDER — SODIUM CHLORIDE 0.9 % IJ SOLN
3.0000 mL | Freq: Two times a day (BID) | INTRAMUSCULAR | Status: DC
  Administered 2012-08-16: 12:00:00 via INTRAVENOUS
  Administered 2012-08-16 – 2012-08-17 (×2): 3 mL via INTRAVENOUS
  Administered 2012-08-17: 11:00:00 via INTRAVENOUS
  Administered 2012-08-18 – 2012-08-24 (×12): 3 mL via INTRAVENOUS

## 2012-08-16 MED ORDER — OSMOLITE 1.5 CAL PO LIQD
1000.0000 mL | ORAL | Status: DC
  Administered 2012-08-16 – 2012-08-17 (×2): 1000 mL
  Filled 2012-08-16 (×4): qty 1000

## 2012-08-16 MED ORDER — IPRATROPIUM-ALBUTEROL 18-103 MCG/ACT IN AERO
6.0000 | INHALATION_SPRAY | Freq: Four times a day (QID) | RESPIRATORY_TRACT | Status: DC
  Administered 2012-08-16 – 2012-08-18 (×8): 6 via RESPIRATORY_TRACT
  Filled 2012-08-16: qty 14.7

## 2012-08-16 MED ORDER — ADULT MULTIVITAMIN LIQUID CH
5.0000 mL | Freq: Every day | ORAL | Status: DC
  Administered 2012-08-16 – 2012-08-18 (×3): 5 mL
  Filled 2012-08-16 (×3): qty 5

## 2012-08-16 MED ORDER — PRO-STAT SUGAR FREE PO LIQD
30.0000 mL | Freq: Four times a day (QID) | ORAL | Status: DC
  Administered 2012-08-16 – 2012-08-18 (×7): 30 mL
  Filled 2012-08-16 (×10): qty 30

## 2012-08-16 MED ORDER — MIDAZOLAM HCL 2 MG/2ML IJ SOLN
1.0000 mg | INTRAMUSCULAR | Status: DC | PRN
  Administered 2012-08-16 – 2012-08-18 (×4): 2 mg via INTRAVENOUS
  Filled 2012-08-16 (×4): qty 2

## 2012-08-16 MED ORDER — ONDANSETRON HCL 4 MG/2ML IJ SOLN
4.0000 mg | Freq: Four times a day (QID) | INTRAMUSCULAR | Status: DC | PRN
  Filled 2012-08-16: qty 2

## 2012-08-16 MED ORDER — FENTANYL CITRATE 0.05 MG/ML IJ SOLN
25.0000 ug | INTRAMUSCULAR | Status: DC | PRN
  Administered 2012-08-17: 25 ug via INTRAVENOUS
  Administered 2012-08-17: 50 ug via INTRAVENOUS
  Filled 2012-08-16 (×2): qty 2

## 2012-08-16 MED ORDER — HEPARIN BOLUS VIA INFUSION
3000.0000 [IU] | Freq: Once | INTRAVENOUS | Status: AC
  Administered 2012-08-16: 3000 [IU] via INTRAVENOUS
  Filled 2012-08-16: qty 3000

## 2012-08-16 MED ORDER — DOBUTAMINE IN D5W 4-5 MG/ML-% IV SOLN: 2.5000 ug/kg/min | INTRAVENOUS | Status: DC

## 2012-08-16 MED FILL — Midazolam HCl Inj 5 MG/5ML (Base Equivalent): INTRAMUSCULAR | Qty: 5 | Status: AC
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
## 2012-08-16 NOTE — Progress Notes (Signed)
Tracheal Aspirate obtained/sent to lab 

## 2012-08-16 NOTE — Care Management Note (Addendum)
    Page 1 of 2   08/24/2012     10:30:48 AM   CARE MANAGEMENT NOTE 08/24/2012  Patient:  Jonathan Craig, Jonathan Craig   Account Number:  [id number]  Date Initiated:  08/16/2012  Documentation initiated by:  Junius Creamer  Subjective/Objective Assessment:   adm w shortness of breath     Action/Plan:   lives w fam, pcp dr a Marilu Favre, chart states has hired assist also.da is supportive.   Anticipated DC Date:     Anticipated DC Plan:        DC Planning Services  CM consult      Choice offered to / List presented to:             Status of service:  Completed, signed off Medicare Important Message given?   (If response is "NO", the following Medicare IM given date fields will be blank) Date Medicare IM given:   Date Additional Medicare IM given:    Discharge Disposition:  HOME/SELF CARE  Per UR Regulation:  Reviewed for med. necessity/level of care/duration of stay  If discussed at Long Length of Stay Meetings, dates discussed:   08/23/2012    Comments:  08-24-12 [address], [place] [phone] CM spoke to pt and daughter [name] [phone] and they agreed to outpatient PT at [facility]. CM did call an d make referral for services. CM called Trish for the Rx for outpatient and then CM will fax inofrmation out this am. Jeani Hawking will call family and set up a time for services. No further needs from CM at this time.   9/11 16:30p debbie dowell rn,bsn took hhc agency list to [employer]. checked w all agencies on list and no home phy ther avail [place]da states they have transportation and if ok w md that they will uses [facility] phy the[facility]. left sticky note for md if he agrees to write prescription for outpt phy ther at [facility]. da has [employer] (wheeled walker w seat that can also be used as w/c) for pt.  9/5 11:40a debbie dowell rn,bsn [phone] spoke w da debra Schulte. hx of cares[facility] for hhc. not sure of dc needs yet. will cont to follow.

## 2012-08-17 ENCOUNTER — Inpatient Hospital Stay (HOSPITAL_COMMUNITY): Payer: BC Managed Care – PPO

## 2012-08-17 ENCOUNTER — Encounter (HOSPITAL_COMMUNITY): Payer: Self-pay

## 2012-08-17 DIAGNOSIS — J449 Chronic obstructive pulmonary disease, unspecified: Secondary | ICD-10-CM

## 2012-08-17 DIAGNOSIS — I214 Non-ST elevation (NSTEMI) myocardial infarction: Secondary | ICD-10-CM

## 2012-08-17 DIAGNOSIS — J9600 Acute respiratory failure, unspecified whether with hypoxia or hypercapnia: Secondary | ICD-10-CM

## 2012-08-17 DIAGNOSIS — I5022 Chronic systolic (congestive) heart failure: Secondary | ICD-10-CM

## 2012-08-17 DIAGNOSIS — I428 Other cardiomyopathies: Secondary | ICD-10-CM

## 2012-08-17 DIAGNOSIS — I509 Heart failure, unspecified: Secondary | ICD-10-CM

## 2012-08-17 LAB — BLOOD GAS, ARTERIAL
Drawn by: 331761
FIO2: 0.3 %
MECHVT: 500 mL
RATE: 16 resp/min
TCO2: 32 mmol/L (ref 0–100)
pCO2 arterial: 42.7 mmHg (ref 35.0–45.0)
pH, Arterial: 7.469 — ABNORMAL HIGH (ref 7.350–7.450)
pO2, Arterial: 86 mmHg (ref 80.0–100.0)

## 2012-08-17 LAB — URINE CULTURE: Culture: NO GROWTH

## 2012-08-17 LAB — GLUCOSE, CAPILLARY
Glucose-Capillary: 116 mg/dL — ABNORMAL HIGH (ref 70–99)
Glucose-Capillary: 125 mg/dL — ABNORMAL HIGH (ref 70–99)
Glucose-Capillary: 143 mg/dL — ABNORMAL HIGH (ref 70–99)
Glucose-Capillary: 144 mg/dL — ABNORMAL HIGH (ref 70–99)

## 2012-08-17 LAB — CBC
HCT: 37.5 % — ABNORMAL LOW (ref 39.0–52.0)
Hemoglobin: 12.4 g/dL — ABNORMAL LOW (ref 13.0–17.0)
MCV: 97.2 fL (ref 78.0–100.0)
WBC: 10.9 10*3/uL — ABNORMAL HIGH (ref 4.0–10.5)

## 2012-08-17 LAB — CK TOTAL AND CKMB (NOT AT ARMC)
CK, MB: 5.5 ng/mL — ABNORMAL HIGH (ref 0.3–4.0)
Relative Index: 2.4 (ref 0.0–2.5)
Total CK: 230 U/L (ref 7–232)

## 2012-08-17 LAB — POCT I-STAT 3, ART BLOOD GAS (G3+)
Acid-Base Excess: 9 mmol/L — ABNORMAL HIGH (ref 0.0–2.0)
Bicarbonate: 32.7 mEq/L — ABNORMAL HIGH (ref 20.0–24.0)
Patient temperature: 97.5
pH, Arterial: 7.509 — ABNORMAL HIGH (ref 7.350–7.450)

## 2012-08-17 LAB — BASIC METABOLIC PANEL
BUN: 30 mg/dL — ABNORMAL HIGH (ref 6–23)
CO2: 29 mEq/L (ref 19–32)
CO2: 30 mEq/L (ref 19–32)
CO2: 25 mEq/L (ref 19–32)
Calcium: 9.2 mg/dL (ref 8.4–10.5)
Chloride: 97 mEq/L (ref 96–112)
Chloride: 95 mEq/L — ABNORMAL LOW (ref 96–112)
Creatinine, Ser: 0.76 mg/dL (ref 0.50–1.35)
GFR calc Af Amer: >90 mL/min (ref >90)
GFR calc Af Amer: >90 mL/min (ref >90)
Glucose, Bld: 138 mg/dL — ABNORMAL HIGH (ref 70–99)
Potassium: 2.9 mEq/L — ABNORMAL LOW (ref 3.5–5.1)
Potassium: 3.1 mEq/L — ABNORMAL LOW (ref 3.5–5.1)
Potassium: 4.6 mEq/L (ref 3.5–5.1)
Sodium: 135 mEq/L (ref 135–145)
Sodium: 136 mEq/L (ref 135–145)

## 2012-08-17 LAB — PROTIME-INR: INR: 1.53 — ABNORMAL HIGH (ref 0.00–1.49)

## 2012-08-17 LAB — TROPONIN I
Troponin I: 2.05 ng/mL (ref <0.30)
Troponin I: 0.62 ng/mL (ref <0.30)

## 2012-08-17 MED ORDER — POTASSIUM CHLORIDE 20 MEQ/15ML (10%) PO LIQD
ORAL | Status: AC
  Filled 2012-08-17: qty 30

## 2012-08-17 MED ORDER — ENOXAPARIN SODIUM 60 MG/0.6ML ~~LOC~~ SOLN
60.0000 mg | Freq: Once | SUBCUTANEOUS | Status: AC
  Administered 2012-08-17: 60 mg via SUBCUTANEOUS
  Filled 2012-08-17: qty 0.6

## 2012-08-17 MED ORDER — POTASSIUM CHLORIDE 20 MEQ/15ML (10%) PO LIQD
40.0000 meq | Freq: Two times a day (BID) | ORAL | Status: DC
  Administered 2012-08-17 – 2012-08-18 (×2): 40 meq via ORAL
  Filled 2012-08-17 (×3): qty 30

## 2012-08-17 MED ORDER — FUROSEMIDE 10 MG/ML IJ SOLN
40.0000 mg | Freq: Two times a day (BID) | INTRAMUSCULAR | Status: DC
  Administered 2012-08-17 – 2012-08-20 (×8): 40 mg via INTRAVENOUS
  Filled 2012-08-17 (×10): qty 4

## 2012-08-17 MED ORDER — ENOXAPARIN SODIUM 60 MG/0.6ML ~~LOC~~ SOLN
60.0000 mg | Freq: Two times a day (BID) | SUBCUTANEOUS | Status: DC
  Administered 2012-08-18: 60 mg via SUBCUTANEOUS
  Filled 2012-08-17 (×3): qty 0.6

## 2012-08-17 MED ORDER — LISINOPRIL 2.5 MG PO TABS
2.5000 mg | ORAL_TABLET | Freq: Every day | ORAL | Status: DC
  Administered 2012-08-17 – 2012-08-23 (×7): 2.5 mg via ORAL
  Filled 2012-08-17 (×7): qty 1

## 2012-08-17 MED ORDER — POTASSIUM CHLORIDE 20 MEQ/15ML (10%) PO LIQD
40.0000 meq | Freq: Once | ORAL | Status: AC
  Administered 2012-08-17: 40 meq via ORAL
  Filled 2012-08-17: qty 30

## 2012-08-17 MED ORDER — HEPARIN BOLUS VIA INFUSION
2000.0000 [IU] | Freq: Once | INTRAVENOUS | Status: AC
  Administered 2012-08-17: 2000 [IU] via INTRAVENOUS
  Filled 2012-08-17: qty 2000

## 2012-08-17 MED ORDER — POTASSIUM CHLORIDE CRYS ER 20 MEQ PO TBCR
40.0000 meq | EXTENDED_RELEASE_TABLET | Freq: Once | ORAL | Status: AC
  Administered 2012-08-17: 40 meq via ORAL

## 2012-08-17 NOTE — Progress Notes (Signed)
ANTICOAGULATION CONSULT NOTE - Follow Up Consult  Pharmacy Consult for heparin Indication: ACS  No Known Allergies  Patient Measurements: Height: 5\' 10"  (177.8 cm) Weight: 141 lb 1.5 oz (64 kg) IBW/kg (Calculated) : 73  Heparin Dosing Weight: 64 kg   Vital Signs: Temp: 97.7 F (36.5 C) (09/05 2300) Temp src: Axillary (09/05 2300) BP: 125/42 mmHg (09/06 0105) Pulse Rate: 100  (09/06 0105)  Labs:  Basename 08/17/12 0016 08/16/12 1837 08/16/12 1301  HGB -- -- 12.2*  HCT -- -- 36.0*  PLT -- -- 109*  APTT -- -- --  LABPROT -- -- 18.0*  INR -- -- 1.46  HEPARINUNFRC 0.21* -- --  CREATININE -- -- 1.02  CKTOTAL -- -- --  CKMB -- -- --  TROPONINI -- 0.62* 2.63*    Estimated Creatinine Clearance: 47.9 ml/min (by C-G formula based on Cr of 1.02).   Medications:  Scheduled:    . albuterol-ipratropium  6 puff Inhalation Q6H  . antiseptic oral rinse  15 mL Mouth Rinse q12n4p  . aspirin EC  325 mg Oral Daily  . azithromycin  500 mg Intravenous Daily  . cefTRIAXone (ROCEPHIN)  IV  1 g Intravenous Q24H  . chlorhexidine  15 mL Mouth Rinse BID  . feeding supplement (OSMOLITE 1.5 CAL)  1,000 mL Per Tube Q24H  . feeding supplement  30 mL Per Tube QID  . furosemide  40 mg Oral Daily  . heparin  3,000 Units Intravenous Once  . insulin aspart  0-15 Units Subcutaneous Q4H  . methylPREDNISolone (SOLU-MEDROL) injection  40 mg Intravenous Q12H  . multivitamin  5 mL Per Tube Daily  . pantoprazole sodium  40 mg Per Tube Q1200  . sodium chloride  3 mL Intravenous Q12H  . DISCONTD: albuterol  2.5 mg Nebulization Q4H  . DISCONTD: ipratropium  0.5 mg Nebulization Q4H  . DISCONTD: methylPREDNISolone (SOLU-MEDROL) injection  60 mg Intravenous Q8H  . DISCONTD: methylPREDNISolone (SOLU-MEDROL) injection  60 mg Intravenous TID  . DISCONTD: methylPREDNISolone (SOLU-MEDROL) injection  40 mg Intravenous Q12H  . DISCONTD: pantoprazole  40 mg Oral Q1200  . DISCONTD: vancomycin  750 mg Intravenous  Q24H  . DISCONTD: vancomycin  1,000 mg Intravenous Once    Assessment: 78 yo male on IV heparin for ACS. Heparin level (<0.1) is below-goal on 800 units/hr.   Goal of Therapy:  Heparin level 0.3-0.7 units/ml Monitor platelets by anticoagulation protocol: Yes   Plan:  1. Heparin bolus of 2000 units x 1, then increase IV infusion to 1050 units/hr.  2. Heparin level in 8 hours.   Emeline Gins 08/17/2012,2:05 AM

## 2012-08-17 NOTE — Progress Notes (Signed)
ANTICOAGULATION CONSULT NOTE - Follow Up Consult  Pharmacy Consult for heparin>>lovenox Indication: ACS  No Known Allergies  Patient Measurements: Height: 5\' 10"  (177.8 cm) Weight: 141 lb 1.5 oz (64 kg) IBW/kg (Calculated) : 73  Heparin Dosing Weight: 64 kg   Vital Signs: Temp: 97.5 F (36.4 C) (09/06 1149) Temp src: Axillary (09/06 1149) BP: 120/60 mmHg (09/06 1232) Pulse Rate: 74  (09/06 1232)  Labs:  Basename 08/17/12 1054 08/17/12 0634 08/17/12 0016 08/16/12 1837 08/16/12 1301  HGB -- 12.4* -- -- 12.2*  HCT -- 37.5* -- -- 36.0*  PLT -- 102* -- -- 109*  APTT -- -- -- -- --  LABPROT -- 18.7* -- -- 18.0*  INR -- 1.53* -- -- 1.46  HEPARINUNFRC 0.71* -- 0.21* -- --  CREATININE 0.82 0.89 -- -- 1.02  CKTOTAL -- -- -- -- --  CKMB -- -- -- -- --  TROPONINI -- -- 2.05* 0.62* 2.63*    Estimated Creatinine Clearance: 59.6 ml/min (by C-G formula based on Cr of 0.82).   Assessment: 78 yo male on IV heparin for ACS-NSTEMI. Heparin level (0.71) is slightly above goal on 1050 units/hr. Previously increased from 800 units/hr due to subtherapeutic level (<0.1). [place] cardiology approved CCM's suggestion to switch to enoxaparin. Hgb improved 12.4>12.2 but Plt decreasing 102<109.  Goal of Therapy:  Heparin level 0.6-1.2 units/ml 4 hours after LMWH administration Monitor platelets by anticoagulation protocol: Yes   Plan:  1. Initiate enoxaparin 60mg  [place] Q 12 H one hour after the heparin drip discontinued 2. Monitor CBC  Tiney Rouge 08/17/2012,1:08 PM  Patient seen with pharmacy student on rounds. I agree with the above assessment and plan. Will transition to lovenox therapy for patient convenience given no immediate plans for cath. Patient has relatively good renal function considering age. CBC remains stable, unlikely that patient will be on lovenox therapy for any length of time, will not plan on checking an anti-xa level.  Sheppard Coil PharmD.,BCPS

## 2012-08-17 NOTE — Progress Notes (Signed)
eLink Physician-Brief Progress Note Patient Name: TYVON EGGENBERGER DOB: 06/05/1924 MRN: [id number]  Date of Service  08/17/2012   HPI/Events of Note   K still low after KCL   `PLAN Repeat KCL dosing    Intervention Category Major Interventions: Electrolyte abnormality - evaluation and management  Shan Levans 08/17/2012, 3:50 PM

## 2012-08-17 NOTE — Progress Notes (Addendum)
Name: Jonathan Craig MRN: [id number] DOB: [birth year]    LOS: 1  Referring Provider:  Diona Browner Reason for Referral:  Tedd Sias PCP - Virgina Organ  Primary cardiology -- Diona Browner  PULMONARY / CRITICAL CARE MEDICINE  HPI:  78yo male with hx cardiomyopathy, EF 20-25%, mod mitral and tricuspid regurg, COPD, Systolic CHF, previously living at home.  Admitted initially to [facility] 9/1 with SOB, AMS and ?RLL PNA.  During his admission to [facility] he developed NSTEMI and worsening resp failure despite broad spectrum abx, diuresis and dobutamine, acutely decompensated overnight 9/3 requiring intubation and low dose dobutamine. Also cont to have worsening neuro status (versed 5mg  for intubation, ativan q2h for sedation) Troponin cont to increase with peak 2.9 and pt was transferred 9/5 to [facility] to Cardiology service for further treatment and PCCM asked to assist with vent/ medical management.    SUBJ : follows commands intermittently, denies CP, dyspnea Motions to left arm for pain   Vital Signs: Temp:  [97.3 F (36.3 C)-97.7 F (36.5 C)] 97.5 F (36.4 C) (09/06 0805) Pulse Rate:  [37-148] 46  (09/06 0805) Resp:  [15-22] 17  (09/06 0805) BP: (98-150)/(37-70) 136/43 mmHg (09/06 0805) SpO2:  [98 %-100 %] 98 % (09/06 0805) FiO2 (%):  [40 %-60 %] 40 % (09/06 0837) Weight:  [64 kg (141 lb 1.5 oz)] 64 kg (141 lb 1.5 oz) (09/06 0500)  Physical Examination: General:  Frail, chronically ill appearing male, NAD  Neuro:  follows commands intermittently, non focal HEENT:  Mm dry, no JVD, ETT  Cardiovascular:  S1s2 rrr, freq PVC Lungs:  resps even non labored on vent, diminished bases, otherwise clear  Abdomen:  Soft, +bs Ext: warm and dry, 2+ pitting BLE edema   Active Problems:  Acute respiratory failure with hypoxia   ASSESSMENT AND PLAN  PULMONARY  Lab 08/16/12 1323  PHART 7.542*  PCO2ART 32.9*  PO2ART 172.0*  HCO3 28.3*  O2SAT 100.0   Ventilator Settings: Vent Mode:  [-]  PSV;CPAP FiO2 (%):  [40 %-60 %] 40 % Set Rate:  [16 bmp-18 bmp] 16 bmp Vt Set:  [500 mL-580 mL] 500 mL PEEP:  [5 cmH20] 5 cmH20 Pressure Support:  [8 cmH20] 8 cmH20 Plateau Pressure:  [10 cmH20-16 cmH20] 16 cmH20 CXR:  RLL atx ETT:  9/3>>>  A:  Acute respiratory failure -- multifactorial r/t: pulm HTN  ?CAP COPD P:   SBts with goal of extubation - would like to establish reintubation status priro diurese abx for ?CAP - see ID  BD Will cont steroids for ?component AECOPD -reduce dose to 40 q 24h  CARDIOVASCULAR  Lab 08/17/12 0016 08/16/12 1837 08/16/12 1301  TROPONINI 2.05* 0.62* 2.63*  LATICACIDVEN -- -- 2.1  PROBNP -- -- 78469.6*   ECG:  NSR with freq PVC Lines: PIV  A: NSTEMI  non ischemic cardiomyopathy  EF 20%  Acute on chronic Systolic CHF Mitral and tricuspid regurg P:  Per cardiology  On low dose dobutamine  Cont enzymes  Asa ON heaprin -fu plts   RENAL  Lab 08/17/12 0634 08/16/12 1301  NA 135 132*  K 2.9* 3.0*  CL 96 93*  CO2 29 28  BUN 30* 28*  CREATININE 0.89 1.02  CALCIUM 9.2 9.5  MG -- 2.2  PHOS -- 2.4   Intake/Output      09/05 0701 - 09/06 0700 09/06 0701 - 09/07 0700   I.V. (mL/kg) 438.7 (6.9)    NG/GT 390    IV Piggyback 300  Total Intake(mL/kg) 1128.7 (17.6)    Urine (mL/kg/hr) 2575 (1.7)    Total Output 2575    Net -1446.3          Foley:  9/3>>>  A:  Hypokalemia   Hyponatremia -resolving  P:   Monitor chem  Replete K  GASTROINTESTINAL  Lab 08/16/12 1301  AST 56*  ALT 141*  ALKPHOS 96  BILITOT 0.9  PROT 6.0  ALBUMIN 2.9*    A:  Protein calorie malnutrition  P:   Will initiate TF   HEMATOLOGIC  Lab 08/17/12 0634 08/16/12 1301  HGB 12.4* 12.2*  HCT 37.5* 36.0*  PLT 102* 109*  INR 1.53* 1.46  APTT -- --   A:  Thrombocytopenia  P:  F/u cbc   INFECTIOUS  Lab 08/17/12 0634 08/16/12 1301  WBC 10.9* 11.5*  PROCALCITON -- 0.33   Cultures: [facility] 9/2 >> neg ([place]) BC x 2 9/5>>> Urine  9/5>>> Sputum 9/5>>>  Antibiotics: Rocephin 9/3>>> Azithro 9/3>>> Vanc 9/3>>>9/5  A:  RLL CAP -not convincing on CXR, pct low P:   If  Cultures neg, abx until 9/9   ENDOCRINE  Lab 08/17/12 0833 08/17/12 0353 08/16/12 2319 08/16/12 1957 08/16/12 1556  GLUCAP 143* 144* 137* 143* 141*   A:  Hyperglycemia   P:   SSI   NEUROLOGIC  A:  AMS  - likely in setting resp failure and NSTEMI.  However remains minimally interactive despite very little sedation. Also caregiver reports facial droop on adm. Head CT-no bleed, cannot r/o infarct P:   Limit sedation Intermittent sedation PRN while on vent  Doubt rpt imaging needed - can follow clinically  Discussions were had with family regarding goals of care. Some mention was made of potential for withdrawal of care, however family and Dr. Diona Browner felt it was too early in the course of this acute illness and family wishes to proceed with further cardiac and neuro workup but would not want prolonged support.  9/6 updated daughter debra -discussed reintubation status -she needs more time to decide, possibly involve pt in this decision making  BEST PRACTICE / DISPOSITION Level of Care:  ICU Primary Service:  Cards  Consultants:  PCCM Code Status:  full Diet:  TF DVT Px:  SCD's GI Px:  protonix Skin Integrity:  intact Social / Family:  Updated at length at bedside   [facility] Pager: [phone]   Care during the described time interval was provided by me and/or other providers on the critical care team.  I have reviewed this patient's available data, including medical history, events of note, physical examination and test results as part of my evaluation  CC time x  35 m  Cyril Mourning MD. Tonny Bollman. [facility] Pager [phone] If no response call [phone]    08/17/2012, 10:11 AM

## 2012-08-17 NOTE — Progress Notes (Signed)
Dr. Veryl Speak notified of Troponin 2.05. No new orders at this time.

## 2012-08-17 NOTE — Progress Notes (Signed)
Subjective: Patient opens eyes.  Not that responsive Objective: Filed Vitals:   08/17/12 0500 08/17/12 0600 08/17/12 0700 08/17/12 0803  BP: 134/53 150/51 144/70 144/70  Pulse: 148 79 68 71  Temp:      TempSrc:      Resp: 19 19 18 15   Height:      Weight: 141 lb 1.5 oz (64 kg)     SpO2: 100% 100% 100% 100%   Weight change:   Intake/Output Summary (Last 24 hours) at 08/17/12 0914 Last data filed at 08/17/12 0700  Gross per 24 hour  Intake 1128.68 ml  Output   2575 ml  Net -1446.32 ml    General:  Opens eyes.  Intubated. Neck:  JVP is difficult to assess. Heart: Regular rate and rhythm, without murmurs, rubs, gallops.  Lungs: Coarse BS Exemities:  No edema.  Socks on Neuro: Deferred further.  Tele:  SR  Frequent PVCs Lab Results: Results for orders placed during the hospital encounter of 08/16/12 (from the past 24 hour(s))  CULTURE, BLOOD (ROUTINE X 2)     Status: Normal (Preliminary result)   Collection Time   08/16/12 12:28 PM      Component Value Range   Specimen Description BLOOD ARM LEFT     Special Requests BOTTLES DRAWN AEROBIC AND ANAEROBIC 10CC     Culture  Setup Time 08/16/2012 16:28     Culture       Value:        BLOOD CULTURE RECEIVED NO GROWTH TO DATE CULTURE WILL BE HELD FOR 5 DAYS BEFORE ISSUING A FINAL NEGATIVE REPORT   Report Status PENDING    GLUCOSE, CAPILLARY     Status: Abnormal   Collection Time   08/16/12 12:32 PM      Component Value Range   Glucose-Capillary 170 (*) 70 - 99 mg/dL  CULTURE, BLOOD (ROUTINE X 2)     Status: Normal (Preliminary result)   Collection Time   08/16/12 12:33 PM      Component Value Range   Specimen Description BLOOD FOREARM LEFT     Special Requests BOTTLES DRAWN AEROBIC AND ANAEROBIC 10CC     Culture  Setup Time 08/16/2012 16:28     Culture       Value:        BLOOD CULTURE RECEIVED NO GROWTH TO DATE CULTURE WILL BE HELD FOR 5 DAYS BEFORE ISSUING A FINAL NEGATIVE REPORT   Report Status PENDING    COMPREHENSIVE  METABOLIC PANEL     Status: Abnormal   Collection Time   08/16/12  1:01 PM      Component Value Range   Sodium 132 (*) 135 - 145 mEq/L   Potassium 3.0 (*) 3.5 - 5.1 mEq/L   Chloride 93 (*) 96 - 112 mEq/L   CO2 28  19 - 32 mEq/L   Glucose, Bld 170 (*) 70 - 99 mg/dL   BUN 28 (*) 6 - 23 mg/dL   Creatinine, Ser 1.30  0.50 - 1.35 mg/dL   Calcium 9.5  8.4 - 86.5 mg/dL   Total Protein 6.0  6.0 - 8.3 g/dL   Albumin 2.9 (*) 3.5 - 5.2 g/dL   AST 56 (*) 0 - 37 U/L   ALT 141 (*) 0 - 53 U/L   Alkaline Phosphatase 96  39 - 117 U/L   Total Bilirubin 0.9  0.3 - 1.2 mg/dL   GFR calc non Af Amer 65 (*) >90 mL/min   GFR calc Af Denyse Dago  75 (*) >90 mL/min  MAGNESIUM     Status: Normal   Collection Time   08/16/12  1:01 PM      Component Value Range   Magnesium 2.2  1.5 - 2.5 mg/dL  PHOSPHORUS     Status: Normal   Collection Time   08/16/12  1:01 PM      Component Value Range   Phosphorus 2.4  2.3 - 4.6 mg/dL  PROCALCITONIN     Status: Normal   Collection Time   08/16/12  1:01 PM      Component Value Range   Procalcitonin 0.33    LACTIC ACID, PLASMA     Status: Normal   Collection Time   08/16/12  1:01 PM      Component Value Range   Lactic Acid, Venous 2.1  0.5 - 2.2 mmol/L  PRO B NATRIURETIC PEPTIDE     Status: Abnormal   Collection Time   08/16/12  1:01 PM      Component Value Range   Pro B Natriuretic peptide (BNP) 26636.0 (*) 0 - 450 pg/mL  CBC     Status: Abnormal   Collection Time   08/16/12  1:01 PM      Component Value Range   WBC 11.5 (*) 4.0 - 10.5 K/uL   RBC 3.79 (*) 4.22 - 5.81 MIL/uL   Hemoglobin 12.2 (*) 13.0 - 17.0 g/dL   HCT 78.2 (*) 95.6 - 21.3 %   MCV 95.0  78.0 - 100.0 fL   MCH 32.2  26.0 - 34.0 pg   MCHC 33.9  30.0 - 36.0 g/dL   RDW 08.6 (*) 57.8 - 46.9 %   Platelets 109 (*) 150 - 400 K/uL  PROTIME-INR     Status: Abnormal   Collection Time   08/16/12  1:01 PM      Component Value Range   Prothrombin Time 18.0 (*) 11.6 - 15.2 seconds   INR 1.46  0.00 - 1.49  HEMOGLOBIN A1C      Status: Abnormal   Collection Time   08/16/12  1:01 PM      Component Value Range   Hemoglobin A1C 6.7 (*) <5.7 %   Mean Plasma Glucose 146 (*) <117 mg/dL  TROPONIN I     Status: Abnormal   Collection Time   08/16/12  1:01 PM      Component Value Range   Troponin I 2.63 (*) <0.30 ng/mL  POCT I-STAT 3, BLOOD GAS (G3+)     Status: Abnormal   Collection Time   08/16/12  1:23 PM      Component Value Range   pH, Arterial 7.542 (*) 7.350 - 7.450   pCO2 arterial 32.9 (*) 35.0 - 45.0 mmHg   pO2, Arterial 172.0 (*) 80.0 - 100.0 mmHg   Bicarbonate 28.3 (*) 20.0 - 24.0 mEq/L   TCO2 29  0 - 100 mmol/L   O2 Saturation 100.0     Acid-Base Excess 6.0 (*) 0.0 - 2.0 mmol/L   Patient temperature 98.6 F     Collection site BRACHIAL ARTERY     Drawn by RT     Sample type ARTERIAL    MRSA PCR SCREENING     Status: Normal   Collection Time   08/16/12  2:55 PM      Component Value Range   MRSA by PCR NEGATIVE  NEGATIVE  GLUCOSE, CAPILLARY     Status: Abnormal   Collection Time   08/16/12  3:56 PM  Component Value Range   Glucose-Capillary 141 (*) 70 - 99 mg/dL  CULTURE, RESPIRATORY     Status: Normal (Preliminary result)   Collection Time   08/16/12  4:25 PM      Component Value Range   Specimen Description TRACHEAL ASPIRATE     Special Requests NONE     Gram Stain       Value: ABUNDANT WBC PRESENT,BOTH PMN AND MONONUCLEAR     NO SQUAMOUS EPITHELIAL CELLS SEEN     NO ORGANISMS SEEN   Culture PENDING     Report Status PENDING    TROPONIN I     Status: Abnormal   Collection Time   08/16/12  6:37 PM      Component Value Range   Troponin I 0.62 (*) <0.30 ng/mL  GLUCOSE, CAPILLARY     Status: Abnormal   Collection Time   08/16/12  7:57 PM      Component Value Range   Glucose-Capillary 143 (*) 70 - 99 mg/dL  GLUCOSE, CAPILLARY     Status: Abnormal   Collection Time   08/16/12 11:19 PM      Component Value Range   Glucose-Capillary 137 (*) 70 - 99 mg/dL  HEPARIN LEVEL (UNFRACTIONATED)      Status: Abnormal   Collection Time   08/17/12 12:16 AM      Component Value Range   Heparin Unfractionated 0.21 (*) 0.30 - 0.70 IU/mL  TROPONIN I     Status: Abnormal   Collection Time   08/17/12 12:16 AM      Component Value Range   Troponin I 2.05 (*) <0.30 ng/mL  GLUCOSE, CAPILLARY     Status: Abnormal   Collection Time   08/17/12  3:53 AM      Component Value Range   Glucose-Capillary 144 (*) 70 - 99 mg/dL  BASIC METABOLIC PANEL     Status: Abnormal   Collection Time   08/17/12  6:34 AM      Component Value Range   Sodium 135  135 - 145 mEq/L   Potassium 2.9 (*) 3.5 - 5.1 mEq/L   Chloride 96  96 - 112 mEq/L   CO2 29  19 - 32 mEq/L   Glucose, Bld 138 (*) 70 - 99 mg/dL   BUN 30 (*) 6 - 23 mg/dL   Creatinine, Ser 4.09  0.50 - 1.35 mg/dL   Calcium 9.2  8.4 - 81.1 mg/dL   GFR calc non Af Amer 76 (*) >90 mL/min   GFR calc Af Amer 88 (*) >90 mL/min  PROTIME-INR     Status: Abnormal   Collection Time   08/17/12  6:34 AM      Component Value Range   Prothrombin Time 18.7 (*) 11.6 - 15.2 seconds   INR 1.53 (*) 0.00 - 1.49  CBC     Status: Abnormal   Collection Time   08/17/12  6:34 AM      Component Value Range   WBC 10.9 (*) 4.0 - 10.5 K/uL   RBC 3.86 (*) 4.22 - 5.81 MIL/uL   Hemoglobin 12.4 (*) 13.0 - 17.0 g/dL   HCT 91.4 (*) 78.2 - 95.6 %   MCV 97.2  78.0 - 100.0 fL   MCH 32.1  26.0 - 34.0 pg   MCHC 33.1  30.0 - 36.0 g/dL   RDW 21.3 (*) 08.6 - 57.8 %   Platelets 102 (*) 150 - 400 K/uL  GLUCOSE, CAPILLARY     Status: Abnormal  Collection Time   08/17/12  8:33 AM      Component Value Range   Glucose-Capillary 143 (*) 70 - 99 mg/dL    Studies/Results: @RISRSLT24 @ 12 lead EKG:  SR  68  Firsd degree AVB.  LAFB  RBBB.  LVH.  RVH. Medications: Reviewed.   Patient Active Hospital Problem List: Acute respiratory failure with hypoxia (08/16/2012)   Assessment: CCM following  Patient remains on ventilator. Continue ABX.    Cardiomyopathy. Patient remains on low dose dobutamine.   PO lasix  No other agents. I would switch lasix to po.  COntinue diuresis. Follow labs. Add low dose ACE I   Follow BP and K  Replete COntinue dobutamine for now.  Troponin.   May be due to dubendocardial strain in setting of respiratory problems Continue supportive care.  No plans for intervention now. Keep on heparin for now.  Neuro: Patient has improved some since transfer.  Opens eyes.  Does blink to questions per nursing report, not for me. Have asked neuro to see patient.  LOS: 1 day   Dietrich Pates 08/17/2012, 9:14 AM

## 2012-08-17 NOTE — Progress Notes (Signed)
Attempted SBT, Dobutimine still on, 5/5 briiefly tried for spontaneous VT of 128ml's. Placed back to full support.

## 2012-08-18 DIAGNOSIS — I214 Non-ST elevation (NSTEMI) myocardial infarction: Secondary | ICD-10-CM | POA: Diagnosis present

## 2012-08-18 DIAGNOSIS — E876 Hypokalemia: Secondary | ICD-10-CM

## 2012-08-18 DIAGNOSIS — R9431 Abnormal electrocardiogram [ECG] [EKG]: Secondary | ICD-10-CM | POA: Diagnosis present

## 2012-08-18 DIAGNOSIS — I5023 Acute on chronic systolic (congestive) heart failure: Secondary | ICD-10-CM

## 2012-08-18 LAB — CBC
HCT: 37.1 % — ABNORMAL LOW (ref 39.0–52.0)
Hemoglobin: 11.9 g/dL — ABNORMAL LOW (ref 13.0–17.0)
MCH: 31.3 pg (ref 26.0–34.0)
MCHC: 32.1 g/dL (ref 30.0–36.0)
MCV: 97.6 fL (ref 78.0–100.0)
Platelets: 96 K/uL — ABNORMAL LOW (ref 150–400)
RBC: 3.8 MIL/uL — ABNORMAL LOW (ref 4.22–5.81)
RDW: 20.3 % — ABNORMAL HIGH (ref 11.5–15.5)
WBC: 15.9 K/uL — ABNORMAL HIGH (ref 4.0–10.5)

## 2012-08-18 LAB — BASIC METABOLIC PANEL
BUN: 34 mg/dL — ABNORMAL HIGH (ref 6–23)
Chloride: 100 mEq/L (ref 96–112)
GFR calc Af Amer: >90 mL/min (ref >90)
Glucose, Bld: 173 mg/dL — ABNORMAL HIGH (ref 70–99)
Potassium: 3.6 mEq/L (ref 3.5–5.1)
Sodium: 138 mEq/L (ref 135–145)

## 2012-08-18 LAB — POCT I-STAT 3, ART BLOOD GAS (G3+)
Bicarbonate: 34 mEq/L — ABNORMAL HIGH (ref 20.0–24.0)
Patient temperature: 98.6
TCO2: 35 mmol/L (ref 0–100)
pCO2 arterial: 45.4 mmHg — ABNORMAL HIGH (ref 35.0–45.0)
pH, Arterial: 7.482 — ABNORMAL HIGH (ref 7.350–7.450)

## 2012-08-18 LAB — GLUCOSE, CAPILLARY: Glucose-Capillary: 137 mg/dL — ABNORMAL HIGH (ref 70–99)

## 2012-08-18 LAB — PRO B NATRIURETIC PEPTIDE: Pro B Natriuretic peptide (BNP): 14966 pg/mL — ABNORMAL HIGH (ref 0–450)

## 2012-08-18 MED ORDER — ENOXAPARIN SODIUM 40 MG/0.4ML ~~LOC~~ SOLN
40.0000 mg | SUBCUTANEOUS | Status: DC
  Administered 2012-08-19 – 2012-08-22 (×4): 40 mg via SUBCUTANEOUS
  Filled 2012-08-18 (×5): qty 0.4

## 2012-08-18 MED ORDER — BIOTENE DRY MOUTH MT LIQD
15.0000 mL | Freq: Four times a day (QID) | OROMUCOSAL | Status: DC
  Administered 2012-08-18 – 2012-08-23 (×21): 15 mL via OROMUCOSAL

## 2012-08-18 MED ORDER — FENTANYL CITRATE 0.05 MG/ML IJ SOLN
12.5000 ug | INTRAMUSCULAR | Status: DC | PRN
  Administered 2012-08-18: 25 ug via INTRAVENOUS
  Filled 2012-08-18: qty 2

## 2012-08-18 MED ORDER — ASPIRIN 325 MG PO TABS
325.0000 mg | ORAL_TABLET | Freq: Every day | ORAL | Status: DC
  Administered 2012-08-18 – 2012-08-24 (×7): 325 mg via ORAL
  Filled 2012-08-18 (×7): qty 1

## 2012-08-18 MED ORDER — METHYLPREDNISOLONE SODIUM SUCC 40 MG IJ SOLR: 40.0000 mg | Freq: Every day | INTRAMUSCULAR | Status: DC

## 2012-08-18 NOTE — Progress Notes (Addendum)
Subjective: Patient opens eyes.  Remains intubated but appears to be weaning.   Objective: Filed Vitals:   08/18/12 0500 08/18/12 0600 08/18/12 0800 08/18/12 0816  BP: 118/45 122/38    Pulse: 64 76    Temp:    97.4 F (36.3 C)  TempSrc:    Oral  Resp: 16 8    Height:      Weight: 142 lb 13.7 oz (64.8 kg)     SpO2: 97% 98% 97%    Weight change: 1 lb 12.2 oz (0.8 kg)  Intake/Output Summary (Last 24 hours) at 08/18/12 0830 Last data filed at 08/18/12 0600  Gross per 24 hour  Intake 1417.18 ml  Output   2784 ml  Net -1366.82 ml    General:  Opens eyes to voice. Remains ill/  Intubated. HEENT: ETT in place Neck:  JVP 8cm Heart: Regular rate and rhythm, without murmurs, rubs, gallops.  Lungs: Clear with few basilar rales GI: soft Exemities:  No edema.    Neuro: moves extremites Skin- diffuse ecchymosis  Tele:  SR  Frequent PVCs Lab Results:  reviewed  Medications: Reviewed.  EKG today reveals sinus with frequent PVCs, RBBB/LAD, prolonged QT  A/P:  1. Acute hypoxic respiratory failure multifactoral CHF and possibly pneumonia Slowly improving on antibiotics PCCM to manage the vent Would keep Is and Os negative  2. Acute on chronic systolic CHF Continue diuresis on dobutamine for now On ACE inhibitor  3. NSTEMI- given advanced age, would favor a conservative approach, not a cath candidate Add beta blocker once stable off of dobutamine Stop therapeutic dose lovenox  4. AMS- improving  5. Hypokalemia- BMET is pending Follow K closely  6. Prolonged QT and ventricular ectopy Keep K >3.9, Mg  >1.9 Avoid QT prolonging drugs when able  Prognosis remains quite guarded given comorbidities and advanced age   LOS: 2 days   Hillis Range 08/18/2012, 8:30 AM

## 2012-08-18 NOTE — Progress Notes (Signed)
Name: Jonathan Craig MRN: [id number] DOB: 1924-09-02    LOS: 2  Referring Provider:  Diona Craig Reason for Referral:  Jonathan Craig PCP - Jonathan Craig  Primary cardiology -- Jonathan Craig  PULMONARY / CRITICAL CARE MEDICINE  HPI:  78 y/o male with hx cardiomyopathy, EF 20-25%, mod mitral and tricuspid regurg, COPD, Systolic CHF, previously living at home.  Admitted initially to [facility] 9/1 with SOB, AMS and ?RLL PNA.  During his admission to [facility] he developed NSTEMI and worsening resp failure despite broad spectrum abx, diuresis and dobutamine, acutely decompensated overnight 9/3 requiring intubation and low dose dobutamine. Also cont to have worsening neuro status (versed 5mg  for intubation, ativan q2h for sedation) Troponin cont to increase with peak 2.9 and pt was transferred 9/5 to [facility] to Cardiology service for further treatment and PCCM asked to assist with vent/ medical management.    SUBJ : RN reports no acute events.  Remains on dobutamine per Cards  Vital Signs: Temp:  [97.5 F (36.4 C)-98.8 F (37.1 C)] 97.5 F (36.4 C) (09/07 0400) Pulse Rate:  [46-84] 76  (09/07 0600) Resp:  [8-20] 8  (09/07 0600) BP: (102-144)/(37-70) 122/38 mmHg (09/07 0600) SpO2:  [96 %-100 %] 98 % (09/07 0600) FiO2 (%):  [30 %-40 %] 30 % (09/07 0400) Weight:  [142 lb 13.7 oz (64.8 kg)] 142 lb 13.7 oz (64.8 kg) (09/07 0500)  Physical Examination: General:  Frail, chronically ill appearing male, NAD  Neuro:  follows commands intermittently, non focal HEENT:  Mm dry, no JVD, ETT  Cardiovascular:  S1s2 rrr, freq PVC Lungs:  resps even non labored on vent, diminished bases, otherwise clear  Abdomen:  Soft, +bs Ext: warm and dry, 2+ pitting BLE edema   Active Problems:  Acute respiratory failure with hypoxia   ASSESSMENT AND PLAN  PULMONARY  Lab 08/17/12 2240 08/17/12 1059 08/16/12 1323  PHART 7.469* 7.509* 7.542*  PCO2ART 42.7 40.8 32.9*  PO2ART 86.0 155.0* 172.0*  HCO3 30.6* 32.7*  28.3*  O2SAT 97.0 100.0 100.0   Ventilator Settings: Vent Mode:  [-] PRVC FiO2 (%):  [30 %-40 %] 30 % Set Rate:  [16 bmp] 16 bmp Vt Set:  [500 mL] 500 mL PEEP:  [5 cmH20] 5 cmH20 Pressure Support:  [8 cmH20] 8 cmH20 Plateau Pressure:  [15 cmH20-17 cmH20] 15 cmH20 CXR:  RLL atx ETT:  9/3>>>  A:   Acute respiratory failure -- multifactorial r/t: pulm HTN  ?CAP COPD  P:   SBTs with goal of extubation - would like to establish reintubation status prior diurese abx for ?CAP - see ID  BD Will cont steroids for ?component AECOPD -reduce dose to 40 q 24h 9/7  CARDIOVASCULAR  Lab 08/17/12 1813 08/17/12 0016 08/16/12 1837 08/16/12 1301  TROPONINI 0.62* 2.05* 0.62* 2.63*  LATICACIDVEN -- -- -- 2.1  PROBNP -- -- -- 40981.1*   ECG:  NSR with freq PVC Lines: PIV  A:  NSTEMI non ischemic cardiomyopathy --EF 20%  Acute on chronic Systolic CHF Mitral and tricuspid regurg  P:  Per cardiology  low dose dobutamine  Cont enzymes  Asa On lovenox -fu plts  Monitor QTC with Azithro--noted increase 9/7   RENAL  Lab 08/17/12 1813 08/17/12 1054 08/17/12 0634 08/16/12 1301  NA 133* 136 135 132*  K 4.6 3.1* -- --  CL 95* 97 96 93*  CO2 25 30 29 28   BUN 30* 29* 30* 28*  CREATININE 0.76 0.82 0.89 1.02  CALCIUM 9.2 9.2 9.2 9.5  MG -- -- -- 2.2  PHOS -- -- -- 2.4   Intake/Output      09/06 0701 - 09/07 0700 09/07 0701 - 09/08 0700   I.V. (mL/kg) 391.1 (6)    NG/GT 780    IV Piggyback 304    Total Intake(mL/kg) 1475.1 (22.8)    Urine (mL/kg/hr) 3005 (1.9)    Stool 4    Total Output 3009    Net -1533.9          Foley:  9/3>>>  A:   Hypokalemia   Hyponatremia -resolving  P:   Monitor chem  Replete K  GASTROINTESTINAL  Lab 08/16/12 1301  AST 56*  ALT 141*  ALKPHOS 96  BILITOT 0.9  PROT 6.0  ALBUMIN 2.9*    A:   Protein calorie malnutrition   P:   Continue TF   HEMATOLOGIC  Lab 08/17/12 0634 08/16/12 1301  HGB 12.4* 12.2*  HCT 37.5* 36.0*  PLT  102* 109*  INR 1.53* 1.46  APTT -- --   A:   Thrombocytopenia   P:  F/u cbc   INFECTIOUS  Lab 08/17/12 0634 08/16/12 1301  WBC 10.9* 11.5*  PROCALCITON -- 0.33   Cultures: [facility] 9/2 >> neg ([facility]) BC x 2 9/5>>> Urine 9/5>>> Sputum 9/5>>>  Antibiotics: Rocephin 9/3>>> Azithro 9/3>>> Vanc 9/3>>>9/5  A:   RLL CAP -not convincing on CXR, pct low  P:   If  Cultures neg, abx until 9/9   ENDOCRINE  Lab 08/18/12 0351 08/17/12 2348 08/17/12 1956 08/17/12 1724 08/17/12 0833  GLUCAP 137* 125* 116* 129* 143*   A:   Hyperglycemia    P:   SSI   NEUROLOGIC  A:   AMS  - likely in setting resp failure and NSTEMI.  However remains minimally interactive despite very little sedation. Also caregiver reports facial droop on adm. Head CT-no bleed, cannot r/o infarct  P:   Limit sedation Intermittent sedation PRN while on vent  Doubt rpt imaging needed - can follow clinically  Discussions were had with family regarding goals of care. Some mention was made of potential for withdrawal of care, however family and Dr. Diona Craig felt it was too early in the course of this acute illness and family wishes to proceed with further cardiac and neuro workup but would not want prolonged support.  9/6 updated daughter Jonathan Craig -discussed reintubation status -she needs more time to decide, possibly involve pt in this decision making  SBT this am with ABG 30 minutes after.  Mental status may remain barrier to extubation -does respond some but not as awake as I would like for extubation.  Will follow for extubation.  Repeat labs today given hypokalemia.      BEST PRACTICE / DISPOSITION Level of Care:  ICU Primary Service:  Cards  Consultants:  PCCM Code Status:  full Diet:  TF DVT Px:  SCD's GI Px:  protonix Skin Integrity:  intact Social / Family:  Updated at length at bedside   Jonathan Brim, NP-C [place] [facility] Pgr: [phone] or [phone]    08/18/2012, 7:29  AM   Seen on CCM rounds this morning with resident MD or ACNP above.  Pt examined and database reviewed. I agree with above findings, assessment and plan as reflected in the note above. 40 mins CCM time. Daughter updated in detail. Looks good post extubation. Discussed with Dr Johney Frame. Will D/C DBA  Billy Fischer, MD;  PCCM service; Mobile [phone]

## 2012-08-18 NOTE — Progress Notes (Signed)
08/18/2012- 1125- Pt extubated to 4lpm cannula with sats of 100%- weaned to 3lpm with sats of 99%.  ZO10, rr12, bp132/43.  Pt able to vocalize post extubation.  Small skin tear noted on left cheek where tube holder was postioned prior to extubation.  RN dressed site.  Pt tolerating well.  Will follow pt progress and encourage deep breathing, cough.

## 2012-08-18 NOTE — Progress Notes (Signed)
ANTICOAGULATION CONSULT NOTE - Follow Up Consult  Pharmacy Consult for heparin>>lovenox Indication: ACS  No Known Allergies  Patient Measurements: Height: 5\' 10"  (177.8 cm) Weight: 142 lb 13.7 oz (64.8 kg) IBW/kg (Calculated) : 73   Vital Signs: Temp: 97.5 F (36.4 C) (09/07 0400) Temp src: Oral (09/07 0400) BP: 122/38 mmHg (09/07 0600) Pulse Rate: 76  (09/07 0600)  Labs:  Basename 08/18/12 0545 08/17/12 1813 08/17/12 1054 08/17/12 0634 08/17/12 0016 08/16/12 1837 08/16/12 1301  HGB 11.9* -- -- 12.4* -- -- --  HCT 37.1* -- -- 37.5* -- -- 36.0*  PLT 96* -- -- 102* -- -- 109*  APTT -- -- -- -- -- -- --  LABPROT -- -- -- 18.7* -- -- 18.0*  INR -- -- -- 1.53* -- -- 1.46  HEPARINUNFRC -- -- 0.71* -- 0.21* -- --  CREATININE -- 0.76 0.82 0.89 -- -- --  CKTOTAL -- 230 -- -- -- -- --  CKMB -- 5.5* -- -- -- -- --  TROPONINI -- 0.62* -- -- 2.05* 0.62* --    Estimated Creatinine Clearance: 61.9 ml/min (by C-G formula based on Cr of 0.76).   Assessment: 78 yo male started on IV heparin for ACS-NSTEMI at [facility]. Decision was made to change to enoxaparin to minimize lab draws and not going to acth lab soon.  CBC low stable Pltc 126 on admit to [facility] now 96.  Hg 11.9, no bleeding noted.   Goal of Therapy:  Heparin level 0.6-1.2 units/ml 4 hours after LMWH administration Monitor platelets by anticoagulation protocol: Yes   Plan:  1. Continue enoxaparin 60mg  [place] Q 12 H  2. Monitor CBC and renal function  Marcelino Scot 08/18/2012,7:57 AM

## 2012-08-19 ENCOUNTER — Inpatient Hospital Stay (HOSPITAL_COMMUNITY): Payer: BC Managed Care – PPO

## 2012-08-19 LAB — GLUCOSE, CAPILLARY: Glucose-Capillary: 78 mg/dL (ref 70–99)

## 2012-08-19 LAB — CULTURE, RESPIRATORY W GRAM STAIN

## 2012-08-19 LAB — MAGNESIUM: Magnesium: 2 mg/dL (ref 1.5–2.5)

## 2012-08-19 LAB — BASIC METABOLIC PANEL
Calcium: 9.3 mg/dL (ref 8.4–10.5)
GFR calc Af Amer: >90 mL/min (ref >90)
GFR calc non Af Amer: 84 mL/min — ABNORMAL LOW (ref >90)
Glucose, Bld: 84 mg/dL (ref 70–99)
Potassium: 3.4 mEq/L — ABNORMAL LOW (ref 3.5–5.1)
Sodium: 140 mEq/L (ref 135–145)

## 2012-08-19 MED ORDER — SODIUM CHLORIDE 0.9 % IV SOLN: INTRAVENOUS | Status: DC

## 2012-08-19 MED ORDER — POTASSIUM CHLORIDE CRYS ER 20 MEQ PO TBCR
40.0000 meq | EXTENDED_RELEASE_TABLET | Freq: Every day | ORAL | Status: DC
  Administered 2012-08-19 – 2012-08-21 (×3): 40 meq via ORAL
  Filled 2012-08-19 (×4): qty 2

## 2012-08-19 MED ORDER — SPIRONOLACTONE 25 MG PO TABS
25.0000 mg | ORAL_TABLET | Freq: Every day | ORAL | Status: DC
  Administered 2012-08-19 – 2012-08-24 (×6): 25 mg via ORAL
  Filled 2012-08-19 (×6): qty 1

## 2012-08-19 MED ORDER — POTASSIUM CHLORIDE CRYS ER 20 MEQ PO TBCR: 40.0000 meq | EXTENDED_RELEASE_TABLET | Freq: Two times a day (BID) | ORAL | Status: DC

## 2012-08-19 MED ORDER — POTASSIUM CHLORIDE CRYS ER 20 MEQ PO TBCR
20.0000 meq | EXTENDED_RELEASE_TABLET | Freq: Every day | ORAL | Status: DC
  Administered 2012-08-19 – 2012-08-21 (×3): 20 meq via ORAL
  Filled 2012-08-19 (×4): qty 1

## 2012-08-19 MED ORDER — CARVEDILOL 3.125 MG PO TABS
3.1250 mg | ORAL_TABLET | Freq: Two times a day (BID) | ORAL | Status: DC
  Administered 2012-08-20 – 2012-08-24 (×7): 3.125 mg via ORAL
  Filled 2012-08-19 (×12): qty 1

## 2012-08-19 NOTE — Progress Notes (Signed)
Subjective: Extubated yesterday, no events overnight, some confusion per nursing   Objective: Filed Vitals:   08/19/12 0400 08/19/12 0500 08/19/12 0600 08/19/12 0700  BP: 121/90 123/63 132/50 122/61  Pulse: 115 46  25  Temp: 97.8 F (36.6 C)     TempSrc: Oral     Resp: 19 23 15 16   Height:      Weight:  137 lb 2 oz (62.2 kg)    SpO2: 100% 97%  100%   Weight change: -5 lb 11.7 oz (-2.6 kg)  Intake/Output Summary (Last 24 hours) at 08/19/12 1610 Last data filed at 08/19/12 0600  Gross per 24 hour  Intake  538.8 ml  Output   2505 ml  Net -1966.2 ml    General:  Sleeping but rouses HEENT: OP clear Neck:  JVP 8cm Heart: Regular rate and rhythm with frequent ectopy, without murmurs, rubs, gallops.  Lungs:  few basilar rales GI: soft Exemities:  No edema.  SCDs in place  Neuro: moves extremites Skin- diffuse ecchymosis  Tele:  SR  Frequent PVCs Lab Results:  reviewed  Medications: Reviewed.   A/P:  1. Acute hypoxic respiratory failure Multifactoral.  Improved and extubated CHF and possibly pneumonia Slowly improving on antibiotics PCCM following Would keep Is and Os negative  2. Acute on chronic systolic CHF Continue diuresis Stable off of dobutamine On ACE inhibitor  3. NSTEMI- given advanced age, would favor a conservative approach, not a cath candidate Add beta blocker once stable off of dobutamine (likely tomorrow)  4. AMS- improving  5. Hypokalemia- Follow K closely  6. Prolonged QT and ventricular ectopy Keep K >3.9, Mg  >1.9 Avoid QT prolonging drugs when able  Prognosis remains quite guarded given comorbidities and advanced age   LOS: 3 days   Hillis Range 08/19/2012, 8:11 AM

## 2012-08-19 NOTE — Progress Notes (Addendum)
Name: JAYLN MADEIRA MRN: [id number] DOB: 1924-10-05    LOS: 3  Referring Provider:  Diona Browner Reason for Referral:  Tedd Sias PCP - Foye Spurling cardiology -- Diona Browner  PULMONARY / CRITICAL CARE MEDICINE  HPI:  78 y/o male with hx cardiomyopathy, EF 20-25%, mod mitral and tricuspid regurg, COPD, Systolic CHF, previously living at home.  Admitted initially to [facility] 9/1 with SOB, AMS and ?RLL PNA.  During his admission to [facility] he developed NSTEMI and worsening resp failure despite broad spectrum abx, diuresis and dobutamine, acutely decompensated overnight 9/3 requiring intubation and low dose dobutamine. Also cont to have worsening neuro status (versed 5mg  for intubation, ativan q2h for sedation) Troponin cont to increase with peak 2.9 and pt was transferred 9/5 to [facility] to Cardiology service for further treatment and PCCM asked to assist with vent/ medical management.    SUBJ : Extubated, no acute events.  Some confusion per RN.    Vital Signs: Temp:  [97.2 F (36.2 C)-97.8 F (36.6 C)] 97.8 F (36.6 C) (09/08 0400) Pulse Rate:  [25-117] 25  (09/08 0700) Resp:  [11-23] 16  (09/08 0700) BP: (102-132)/(31-90) 122/61 mmHg (09/08 0700) SpO2:  [96 %-100 %] 100 % (09/08 0700) Weight:  [137 lb 2 oz (62.2 kg)] 137 lb 2 oz (62.2 kg) (09/08 0500)  Physical Examination: General:  Frail, chronically ill appearing male, NAD  Neuro:  follows commands intermittently, non focal HEENT:  Mm dry, no JVD, ETT  Cardiovascular:  S1s2 rrr, freq PVC Lungs:  resps even non labored on vent, diminished bases, otherwise clear  Abdomen:  Soft, +bs Ext: warm and dry, 2+ pitting BLE edema   Active Problems:  Acute respiratory failure with hypoxia  Prolonged QT interval  NSTEMI (non-ST elevated myocardial infarction)   ASSESSMENT AND PLAN  PULMONARY  Lab 08/18/12 0927 08/17/12 2240 08/17/12 1059 08/16/12 1323  PHART 7.482* 7.469* 7.509* 7.542*  PCO2ART 45.4* 42.7 40.8 32.9*    PO2ART 72.0* 86.0 155.0* 172.0*  HCO3 34.0* 30.6* 32.7* 28.3*  O2SAT 95.0 97.0 100.0 100.0   Ventilator Settings:   CXR:  9/8 RLL atx ETT:  9/3>>>9/7  A:   Acute respiratory failure -- multifactorial r/t: pulm HTN, ?CAP, COPD  P:   diurese abx for ?CAP - see ID  BD Upright positioning, PT/OT efforts IS / pulmonary hygiene   CARDIOVASCULAR  Lab 08/18/12 0545 08/17/12 1813 08/17/12 0016 08/16/12 1837 08/16/12 1301  TROPONINI -- 0.62* 2.05* 0.62* 2.63*  LATICACIDVEN -- -- -- -- 2.1  PROBNP 14966.0* -- -- -- 26636.0*   ECG:  NSR with freq PVC Lines: PIV  A:  NSTEMI non ischemic cardiomyopathy --EF 20%  Acute on chronic Systolic CHF Mitral and tricuspid regurg  P:  Per cardiology   Asa On lovenox -fu plts  Monitor QTC with Azithro--noted increase 9/7   RENAL  Lab 08/19/12 0630 08/18/12 1039 08/17/12 1813 08/17/12 1054 08/17/12 0634 08/16/12 1301  NA 140 138 133* 136 135 --  K 3.4* 3.6 -- -- -- --  CL 100 100 95* 97 96 --  CO2 34* 30 25 30 29  --  BUN 31* 34* 30* 29* 30* --  CREATININE 0.70 0.75 0.76 0.82 0.89 --  CALCIUM 9.3 9.2 9.2 9.2 9.2 --  MG 2.0 -- -- -- -- 2.2  PHOS -- -- -- -- -- 2.4   Intake/Output      09/07 0701 - 09/08 0700 09/08 0701 - 09/09 0700   I.V. (mL/kg)  157.2 (2.5)    NG/GT 120    IV Piggyback 304    Total Intake(mL/kg) 581.2 (9.3)    Urine (mL/kg/hr) 2650 (1.8)    Stool 5    Total Output 2655    Net -2073.8          Foley:  9/3>>>  A:   Hypokalemia   Hyponatremia -resolving  P:   Monitor chem  Replete K, increase K dosing 9/8   GASTROINTESTINAL  Lab 08/16/12 1301  AST 56*  ALT 141*  ALKPHOS 96  BILITOT 0.9  PROT 6.0  ALBUMIN 2.9*    A:   Protein calorie malnutrition   P:   Advance diet to cardiac as tolerated  HEMATOLOGIC  Lab 08/18/12 0545 08/17/12 0634 08/16/12 1301  HGB 11.9* 12.4* 12.2*  HCT 37.1* 37.5* 36.0*  PLT 96* 102* 109*  INR -- 1.53* 1.46  APTT -- -- --   A:   Thrombocytopenia    P:  F/u cbc   INFECTIOUS  Lab 08/18/12 0545 08/17/12 0634 08/16/12 1301  WBC 15.9* 10.9* 11.5*  PROCALCITON -- -- 0.33   Cultures: [facility] 9/2 >> neg ([facility]) BC x 2 9/5>>> Urine 9/5>>> Sputum 9/5>>>  Antibiotics: Rocephin 9/3>>> Azithro 9/3>>> Vanc 9/3>>>9/5  A:   RLL CAP -not convincing on CXR, pct low  P:   If Cultures neg, abx until 9/9--end date in place   ENDOCRINE  Lab 08/19/12 0803 08/18/12 0816 08/18/12 0351 08/17/12 2348 08/17/12 1956  GLUCAP 78 167* 137* 125* 116*   A:   Hyperglycemia    P:   SSI   NEUROLOGIC  A:   AMS  - likely in setting resp failure and NSTEMI.   P:   Mental status improved 9/8, extubated   Ok for SDU.     BEST PRACTICE / DISPOSITION Level of Care:  ICU Primary Service:  Cards  Consultants:  PCCM Code Status:  full Diet:  TF DVT Px:  SCD's GI Px:  protonix Skin Integrity:  intact Social / Family:  Updated at length at bedside   [name], NP-C [place] [facility] Pgr: [phone] or [phone]    08/19/2012, 8:54 AM    Seen on CCM rounds this morning with resident MD or ACNP above.  Pt examined and database reviewed. I agree with above findings, assessment and plan as reflected in the note above.  Needs further discussion re: advanced directives. Poor candidate for repeated intubations and high risk for same  Billy Fischer, MD;  PCCM service; Mobile [phone]

## 2012-08-19 NOTE — Progress Notes (Signed)
Pt passed bedside swallow evaluation with no complications.

## 2012-08-20 DIAGNOSIS — R9431 Abnormal electrocardiogram [ECG] [EKG]: Secondary | ICD-10-CM | POA: Diagnosis present

## 2012-08-20 DIAGNOSIS — I2510 Atherosclerotic heart disease of native coronary artery without angina pectoris: Secondary | ICD-10-CM

## 2012-08-20 DIAGNOSIS — R4182 Altered mental status, unspecified: Secondary | ICD-10-CM | POA: Diagnosis present

## 2012-08-20 LAB — BASIC METABOLIC PANEL
BUN: 24 mg/dL — ABNORMAL HIGH (ref 6–23)
CO2: 37 mEq/L — ABNORMAL HIGH (ref 19–32)
Chloride: 97 mEq/L (ref 96–112)
Creatinine, Ser: 0.73 mg/dL (ref 0.50–1.35)

## 2012-08-20 LAB — CBC
HCT: 34.3 % — ABNORMAL LOW (ref 39.0–52.0)
Hemoglobin: 10.9 g/dL — ABNORMAL LOW (ref 13.0–17.0)
MCV: 99.4 fL (ref 78.0–100.0)
RDW: 19.7 % — ABNORMAL HIGH (ref 11.5–15.5)
WBC: 10.2 10*3/uL (ref 4.0–10.5)

## 2012-08-20 MED ORDER — ALBUTEROL SULFATE (5 MG/ML) 0.5% IN NEBU: 2.5000 mg | INHALATION_SOLUTION | RESPIRATORY_TRACT | Status: DC | PRN

## 2012-08-20 MED ORDER — IPRATROPIUM BROMIDE 0.02 % IN SOLN: 0.5000 mg | RESPIRATORY_TRACT | Status: DC | PRN

## 2012-08-20 NOTE — Progress Notes (Signed)
Nutrition Follow-up  Intervention:   1. Recommend diet advance as able 2. If diet not able to advance today, add Ensure Complete BID.  3. RD will continue to follow    Assessment:   Pt was extubated on 9/7. Passed swallow eval, diet advancing slowly. With improving AMS. Continues to diurese.    Pt family reports pt eating 100% and asking for more. Good appetite, tolerating diet well. Has been getting extra pudding and apple sauce from RN as needed.    Diet Order:  Full Liquid  PO intake: 100% x 2 meals  Meds: Scheduled Meds:   . antiseptic oral rinse  15 mL Mouth Rinse QID  . aspirin  325 mg Oral Daily  . azithromycin  500 mg Intravenous Daily  . carvedilol  3.125 mg Oral BID WC  . cefTRIAXone (ROCEPHIN)  IV  1 g Intravenous Q24H  . enoxaparin (LOVENOX) injection  40 mg Subcutaneous Q24H  . furosemide  40 mg Intravenous Q12H  . lisinopril  2.5 mg Oral Daily  . potassium chloride  20 mEq Oral QHS  . potassium chloride  40 mEq Oral Daily  . sodium chloride  3 mL Intravenous Q12H  . spironolactone  25 mg Oral Daily   Continuous Infusions:   . sodium chloride     PRN Meds:.sodium chloride, acetaminophen, nitroGLYCERIN, ondansetron (ZOFRAN) IV, sodium chloride  Labs:  CMP     Component Value Date/Time   NA 138 08/20/2012 0605   K 3.7 08/20/2012 0605   CL 97 08/20/2012 0605   CO2 37* 08/20/2012 0605   GLUCOSE 80 08/20/2012 0605   BUN 24* 08/20/2012 0605   CREATININE 0.73 08/20/2012 0605   CALCIUM 9.1 08/20/2012 0605   PROT 6.0 08/16/2012 1301   ALBUMIN 2.9* 08/16/2012 1301   AST 56* 08/16/2012 1301   ALT 141* 08/16/2012 1301   ALKPHOS 96 08/16/2012 1301   BILITOT 0.9 08/16/2012 1301   GFRNONAA 82* 08/20/2012 0605   GFRAA >90 08/20/2012 0605     Intake/Output Summary (Last 24 hours) at 08/20/12 0925 Last data filed at 08/20/12 0900  Gross per 24 hour  Intake   1963 ml  Output   1995 ml  Net    -32 ml    Weight Status:  142 lbs, stable with admission weight   Re-estimated needs:   1550-1750 kcal, 65-75 gm protein   Nutrition Dx:  Inadequate oral intake, now resolved   Goal:  EN goal no longer applicable New Goal: Diet advance   Monitor:  PO intake, diet advance, weight, labs   Clarene Duke RD, LDN Pager [phone] After Hours pager [phone]

## 2012-08-20 NOTE — Progress Notes (Signed)
Patient ID: Jonathan Craig, male   DOB: 27-Jul-1924, 78 y.o.   MRN: [id number]   SUBJECTIVE: The patient is lying flat in bed and is comfortable. He's not having any chest pain or shortness of breath. He is in   [place] Vitals:   08/19/12 2000 08/20/12 0000 08/20/12 0353 08/20/12 0500  BP: 107/47 106/57 107/59   Pulse:   85   Temp: 97.4 F (36.3 C) 97.4 F (36.3 C) 98 F (36.7 C)   TempSrc: Oral Oral Oral   Resp:   20   Height:      Weight:    142 lb 6.7 oz (64.6 kg)  SpO2: 98% 100% 100%     Intake/Output Summary (Last 24 hours) at 08/20/12 0758 Last data filed at 08/20/12 0200  Gross per 24 hour  Intake   1979 ml  Output   1895 ml  Net     84 ml    LABS: Basic Metabolic Panel:  Basename 08/20/12 0605 08/19/12 0630  NA 138 140  K 3.7 3.4*  CL 97 100  CO2 37* 34*  GLUCOSE 80 84  BUN 24* 31*  CREATININE 0.73 0.70  CALCIUM 9.1 9.3  MG -- 2.0  PHOS -- --   Liver Function Tests: No results found for this basename: AST:2,ALT:2,ALKPHOS:2,BILITOT:2,PROT:2,ALBUMIN:2 in the last 72 hours No results found for this basename: LIPASE:2,AMYLASE:2 in the last 72 hours CBC:  Basename 08/20/12 0605 08/18/12 0545  WBC 10.2 15.9*  NEUTROABS -- --  HGB 10.9* 11.9*  HCT 34.3* 37.1*  MCV 99.4 97.6  PLT 95* 96*   Cardiac Enzymes:  Basename 08/17/12 1813  CKTOTAL 230  CKMB 5.5*  CKMBINDEX --  TROPONINI 0.62*   BNP: No components found with this basename: POCBNP:3 D-Dimer: No results found for this basename: DDIMER:2 in the last 72 hours Hemoglobin A1C: No results found for this basename: HGBA1C in the last 72 hours Fasting Lipid Panel: No results found for this basename: CHOL,HDL,LDLCALC,TRIG,CHOLHDL,LDLDIRECT in the last 72 hours Thyroid Function Tests: No results found for this basename: TSH,T4TOTAL,FREET3,T3FREE,THYROIDAB in the last 72 hours  RADIOLOGY: Dg Chest Port 1 View  08/19/2012  *RADIOLOGY REPORT*  Clinical Data: 78 year old male acute respiratory failure.   PORTABLE CHEST - 1 VIEW  Comparison: 08/17/2012 and earlier.  Findings: AP portable semi upright AP view 0710 hours.  Extubated. Enteric tube removed.  No central line identified.  Stable lung volumes.  Stable cardiac size and mediastinal contours. Continued patchy opacity at both lung bases.  No pneumothorax, edema or large effusion.  IMPRESSION: 1.  Extubated and enteric tube removed. 2.  Stable lung volumes and basilar hypoventilation.   Original Report Authenticated By: Harley Hallmark, M.D.    Portable Chest Xray In Am  08/17/2012  *RADIOLOGY REPORT*  Clinical Data: Follow up infiltrate, history hypertension, COPD, coronary disease, CHF  PORTABLE CHEST - 1 VIEW  Comparison: Portable exam 0532 hours compared to 08/16/2012  Findings: Tip of endotracheal tube 6.5 cm above carina. Nasogastric tube extends into abdomen. Cardiac monitor leads project over chest. Enlargement of cardiac silhouette. Atherosclerotic calcification aorta. Persistent atelectasis versus infiltrate at lung bases. Upper lungs clear. No pneumothorax.  IMPRESSION: Persistent bibasilar atelectasis versus infiltrate.   Original Report Authenticated By: Lollie Marrow, M.D.    Portable Chest Xray To Verify Ett/ Central Line Placement  08/16/2012  *RADIOLOGY REPORT*  Clinical Data: Central line placement.  Ventilator dependent respiratory failure.  PORTABLE CHEST - 1 VIEW  Comparison: 08/16/2012  Findings:  Endotracheal tube is seen with tip at the level of clavicles.  Nasogastric tube is seen entering the stomach.  No other central line identified, however there is no evidence of pneumothorax.  Cardiomegaly stable. Bibasilar atelectasis versus infiltrates again noted, and also without significant change.  IMPRESSION: Stable cardiomegaly and bibasilar atelectasis versus infiltrates.   Original Report Authenticated By: Danae Orleans, M.D.    Ct Portable Head W/o Cm  08/16/2012  *RADIOLOGY REPORT*  Clinical Data: Altered mental status  CT HEAD  WITHOUT CONTRAST  Technique:  Contiguous axial images were obtained from the base of the skull through the vertex without contrast.  Comparison: None.  Findings: Portable CT equipment was utilized.  The patient was agitated and did not hold still.  The first attempt is nondiagnostic.  A second   attempt was made following IV Versed.  There is extensive motion artifact on the second attempt.  There is limited information available.  There is generalized atrophy.  No hemorrhage or large mass is seen.  Acute infarct cannot be evaluated on this study.  IMPRESSION: The patient could not hold still.  The study has very limited diagnostic information.  No acute hemorrhage is identified.   Original Report Authenticated By: Camelia Phenes, M.D.     PHYSICAL EXAM The patient is lying flat in bed. There is no shortness of breath. His family is in the room. Lungs reveal scattered rhonchi. There is no significant peripheral edema. The abdomen is soft.   TELEMETRY: I have reviewed telemetry today August 20, 2012. There is normal sinus rhythm with PVCs.   ASSESSMENT AND PLAN:  Active Problems:   Hypokalemia    Potassium is 3.7. We will continue to supplement.   Renal insufficiency    BUN is 24 with creatinine 0.73. This is slightly further improved since yesterday.   RBBB   Acute on chronic systolic CHF (congestive heart failure)   Patient appears to be stabilizing in this regard. We'll have to continue to follow the basilar infiltrates on his chest x-ray.   Acute respiratory failure with hypoxia    Chest x-ray reveals bibasilar opacities.There is no edema or effusions.   Prolonged QT interval    This is being followed.   NSTEMI (non-ST elevated myocardial infarction)     This is being followed.   Mental status alteration   The patient's daughter is in his room. She says that his mental status continues to improve. He becomes more confused at night. He is not back to baseline yet.    Willa Rough 08/20/2012 7:58 AM

## 2012-08-20 NOTE — Evaluation (Signed)
Physical Therapy Evaluation Patient Details Name: Jonathan Craig MRN: [id number] DOB: [birth year] Today's Date: 08/20/2012 Time: 4098-1191 PT Time Calculation (min): 33 min  PT Assessment / Plan / Recommendation Clinical Impression  78 yo adm with respiratory failure and spent 2 days on ventilator now presents with confusion and decreased independence due to weakness. Will benefit from PT to address the problems listed below. Pt has 24 hour assist (hired aides) per daughter and should be able to return home.     PT Assessment  Patient needs continued PT services    Follow Up Recommendations  Home health PT;Supervision/Assistance - 24 hour    Barriers to Discharge None      Equipment Recommendations  Other (comment) (TBA--daughter interested in 4 wheeled walker)    Recommendations for Other Services OT consult   Frequency Min 3X/week    Precautions / Restrictions Precautions Precautions: Fall Restrictions Weight Bearing Restrictions: No   Pertinent Vitals/Pain Denies pain; HR very irregular with multiple PVCs; rate varied but generally increased from 64 to 91 SaO2 96% on 2L O2 throughout       Mobility  Bed Mobility Bed Mobility: Rolling Left;Left Sidelying to Sit;Sitting - Scoot to Edge of Bed Rolling Left: 4: Min assist;With rail Left Sidelying to Sit: 4: Min assist;HOB flat;With rails Sitting - Scoot to Edge of Bed: 4: Min guard Details for Bed Mobility Assistance: vc for technique; assist due to weakness Transfers Transfers: Sit to Stand;Stand to Sit Sit to Stand: 3: Mod assist;With upper extremity assist;From bed Stand to Sit: 4: Min assist;With upper extremity assist;With armrests;To chair/3-in-1 Details for Transfer Assistance: Pt is used to using a lift chair at home and needed assist to initiate coming to stand; able to do minisquats and stepping in place prior to ambulating to chair Ambulation/Gait Ambulation/Gait Assistance: 3: Mod assist Ambulation Distance  (Feet): 3 Feet Assistive device: Rolling walker Ambulation/Gait Assistance Details: assist to manuever RW and maintain balance and upright posture; easily fatigues Gait Pattern: Step-to pattern;Decreased stride length;Decreased dorsiflexion - left;Trunk flexed;Wide base of support    Exercises Low Level/ICU Exercises Ankle Circles/Pumps: AROM;Both;10 reps;Supine Heel Slides: AROM;Both;5 reps;Supine Stabilized Bridging: AROM;Both;Other reps (comment);Supine (3 reps)   PT Diagnosis: Difficulty walking;Generalized weakness;Altered mental status  PT Problem List: Decreased strength;Decreased activity tolerance;Decreased balance;Decreased mobility;Decreased cognition;Decreased knowledge of use of DME PT Treatment Interventions: DME instruction;Gait training;Stair training;Functional mobility training;Therapeutic activities;Therapeutic exercise;Balance training;Cognitive remediation;Patient/family education   PT Goals Acute Rehab PT Goals PT Goal Formulation: With patient Time For Goal Achievement: 09/03/12 Potential to Achieve Goals: Good Pt will Roll Supine to Left Side: with supervision PT Goal: Rolling Supine to Left Side - Progress: Goal set today Pt will go Supine/Side to Sit: with supervision;with HOB not 0 degrees (comment degree) PT Goal: Supine/Side to Sit - Progress: Goal set today Pt will go Sit to Supine/Side: with supervision;with HOB 0 degrees PT Goal: Sit to Supine/Side - Progress: Goal set today Pt will go Sit to Stand: with supervision;from elevated surface;with upper extremity assist PT Goal: Sit to Stand - Progress: Goal set today Pt will go Stand to Sit: with supervision;with upper extremity assist PT Goal: Stand to Sit - Progress: Goal set today Pt will Ambulate: 51 - 150 feet;with least restrictive assistive device;Other (comment) (minguard assist) PT Goal: Ambulate - Progress: Goal set today  Visit Information  Last PT Received On: 08/20/12 Assistance Needed: +2  (to ambulate)    Subjective Data  Subjective: "I seem to have lost some of my faculties" re:  not remembering name of hospital, month, etc Patient Stated Goal: return home with wife and aides   Prior Functioning  Home Living Lives With: Spouse Available Help at Discharge: Family;Personal care attendant;Available 24 hours/day (per daughter, when wife goes to dialysis, aide stays c pt) Type of Home: House Home Access: Stairs to enter [employer] of Steps: 2 Entrance Stairs-Rails: None Home Layout: Two level;Laundry or work area in Artist of Steps: 11 Alternate Level Stairs-Rails: Right Bathroom Accessibility: Yes How Accessible: Accessible via walker Home Adaptive Equipment: Walker - standard;Straight cane;Other (comment) (lift chair; has adjustable bed ordered (? arrive mid-Sept)) Additional Comments: wife has 4wheel walker  Prior Function Level of Independence: Needs assistance Needs Assistance: Light Housekeeping (grocery shopping; stairs) Able to Take Stairs?: Yes Communication Communication: No difficulties    Cognition  Overall Cognitive Status: Impaired Area of Impairment: Memory Arousal/Alertness: Awake/alert Orientation Level: Place;Time;Situation Behavior During Session: WFL for tasks performed Memory Deficits: unable to recall name of hospital or month after reviewed with him; did recall day of week    Extremity/Trunk Assessment Right Upper Extremity Assessment RUE ROM/Strength/Tone: Deficits RUE ROM/Strength/Tone Deficits: AAROM WFL; strength shoulder 3-/5; elbow 4/5 Left Upper Extremity Assessment LUE ROM/Strength/Tone: Deficits LUE ROM/Strength/Tone Deficits: AAROM WFL; strength shoulder 3-/5; elbow 4/5 Right Lower Extremity Assessment RLE ROM/Strength/Tone: Deficits RLE ROM/Strength/Tone Deficits: AROM WFL; DF 5/5; knee extension 3+/5 RLE Sensation: WFL - Light Touch Left Lower Extremity Assessment LLE  ROM/Strength/Tone: Deficits LLE ROM/Strength/Tone Deficits: AROM WFL; DF 5/5; knee extension 3+/5 LLE Sensation: WFL - Light Touch   Balance Balance Balance Assessed: Yes Static Sitting Balance Static Sitting - Balance Support: Bilateral upper extremity supported;Feet supported Static Sitting - Level of Assistance: 5: Stand by assistance Static Standing Balance Static Standing - Balance Support: Bilateral upper extremity supported Static Standing - Level of Assistance: 4: Min assist Static Standing - Comment/# of Minutes: stood x 60 seconds   End of Session PT - End of Session Equipment Utilized During Treatment: Gait belt Activity Tolerance: Patient limited by fatigue Patient left: in chair;with call bell/phone within reach;with family/visitor present Nurse Communication: Mobility status  GP     Finlee Milo 08/20/2012, 10:41 AM  Pager [phone]

## 2012-08-20 NOTE — Progress Notes (Signed)
Name: Jonathan Craig MRN: [id number] DOB: Nov 17, 1924    LOS: 4  Referring Provider:  Diona Browner Reason for Referral:  Acute respiratory failure  PULMONARY / CRITICAL CARE MEDICINE  HPI:  78 y/o with cardiomyopathy (EF 20-25%) admitted initially to [facility] hospital on 9/1 with suspected pneumonia. Course is complicated by NSTEMI and worsening resp failure requiring intubation on 9/3. Transferred to [facility] on 9/5.  Interval History:  Remains extubated.  No acute overnight issues.  Vital Signs: Temp:  [97.4 F (36.3 C)-98 F (36.7 C)] 98 F (36.7 C) (09/09 0353) Pulse Rate:  [70-89] 89  (09/09 1000) Resp:  [15-20] 20  (09/09 0353) BP: (98-121)/(40-59) 121/51 mmHg (09/09 1000) SpO2:  [97 %-100 %] 97 % (09/09 1000) Weight:  [64.6 kg (142 lb 6.7 oz)] 64.6 kg (142 lb 6.7 oz) (09/09 0500)  Physical Examination: General:  Comfortable, no acute distress Neuro:  Awake, alert, nonfocal HEENT:  No JVD Cardiovascular:  Regular, no murmurs Lungs:  Diminished bilateral air movement Abdomen:  Soft, bowel sounds present Ext: Bilateral edema LE  Active Problems:  Hypokalemia  Renal insufficiency  RBBB  Acute on chronic systolic CHF (congestive heart failure)  Acute respiratory failure with hypoxia  Prolonged QT interval  NSTEMI (non-ST elevated myocardial infarction)  Mental status alteration  QT prolongation  ASSESSMENT AND PLAN  PULMONARY  Lab 08/18/12 0927 08/17/12 2240 08/17/12 1059 08/16/12 1323  PHART 7.482* 7.469* 7.509* 7.542*  PCO2ART 45.4* 42.7 40.8 32.9*  PO2ART 72.0* 86.0 155.0* 172.0*  HCO3 34.0* 30.6* 32.7* 28.3*  O2SAT 95.0 97.0 100.0 100.0   Ventilator Settings:   CXR:  None new ETT:  9/3>>>9/7  A:  Acute respiratory failure, resolved.  Pulmonary edema, resolved. COPD, no evidence of acute exacerbation.  CAP. P:   Goal SpO2 > 92 Supplemental oxygen Bronchodilators PRN Diuresis / antibiotics as below  CARDIOVASCULAR  Lab 08/18/12 0545 08/17/12 1813  08/17/12 0016 08/16/12 1837 08/16/12 1301  TROPONINI -- 0.62* 2.05* 0.62* 2.63*  LATICACIDVEN -- -- -- -- 2.1  PROBNP 14966.0* -- -- -- 91478.2*   ECG:  NSR with freq PVC Lines: PIV  A: NSTEMI.  Cardiomyopathy (EF 20%). Acute on chronic systolic CHF.  MR.  TR. P:  Per Cardiology ASA, Coreg, Lisinopril  RENAL  Lab 08/20/12 0605 08/19/12 0630 08/18/12 1039 08/17/12 1813 08/17/12 1054 08/16/12 1301  NA 138 140 138 133* 136 --  K 3.7 3.4* -- -- -- --  CL 97 100 100 95* 97 --  CO2 37* 34* 30 25 30  --  BUN 24* 31* 34* 30* 29* --  CREATININE 0.73 0.70 0.75 0.76 0.82 --  CALCIUM 9.1 9.3 9.2 9.2 9.2 --  MG -- 2.0 -- -- -- 2.2  PHOS -- -- -- -- -- 2.4   Intake/Output      09/08 0701 - 09/09 0700 09/09 0701 - 09/10 0700   P.O. 1620    I.V. (mL/kg) 55 (0.9)    NG/GT     IV Piggyback 308    Total Intake(mL/kg) 1983 (30.7)    Urine (mL/kg/hr) 1895 (1.2) 100 (0.3)   Stool 0    Total Output 1895 100   Net +88 -100        Urine Occurrence 1 x 1 x   Stool Occurrence 1 x     Foley:  9/3>>>  A:   Hypokalemia. P:   Monitor BMP Lasix, Aldactone  GASTROINTESTINAL  Lab 08/16/12 1301  AST 56*  ALT 141*  ALKPHOS 96  BILITOT 0.9  PROT 6.0  ALBUMIN 2.9*    A:  Protein calorie malnutrition  P:   Cardiac diet  HEMATOLOGIC  Lab 08/20/12 0605 08/18/12 0545 08/17/12 0634 08/16/12 1301  HGB 10.9* 11.9* 12.4* 12.2*  HCT 34.3* 37.1* 37.5* 36.0*  PLT 95* 96* 102* 109*  INR -- -- 1.53* 1.46  APTT -- -- -- --   A:  Thrombocytopenia, stable. P:  Trend CBC  INFECTIOUS  Lab 08/20/12 0605 08/18/12 0545 08/17/12 0634 08/16/12 1301  WBC 10.2 15.9* 10.9* 11.5*  PROCALCITON -- -- -- 0.33   Cultures: [facility] 9/2 >> neg ([place]) Blood 9/5 >>> ntd Urine 9/5 >>> ntd Sputum 9/5 >>> ntd  Antibiotics: Rocephin 9/3>>> Azithro 9/3>>> Vanc 9/3 >>> 9/5  A:  CAP P:   ABx stop today  ENDOCRINE  Lab 08/20/12 0806 08/19/12 1542 08/19/12 0803 08/18/12 0816 08/18/12 0351  GLUCAP 80  94 78 167* 137*   A:  Normoglycemia   P:   D/c SSI  NEUROLOGIC  A:  Acute metabolic encephalopathy, resolved. P:   No interventions required  BEST PRACTICE / DISPOSITION Level of Care:  SDU Primary Service:  Cardiology Consultants:  PCCM Code Status:  Full Diet:  Cardiac diet DVT Px:  Lovenox GI Px:  D/c Protonix as extubated Skin Integrity:  intact Social / Family:  Not available  PCCM will sign off.  Please reconsult if necessary.  Orlean Bradford, M.D., F.C.C.P. Pulmonary and Critical Care Medicine [facility] Cell: [phone] Pager: [phone]  08/20/2012, 12:07 PM

## 2012-08-21 LAB — BASIC METABOLIC PANEL
CO2: 38 mEq/L — ABNORMAL HIGH (ref 19–32)
Calcium: 9 mg/dL (ref 8.4–10.5)
Chloride: 100 mEq/L (ref 96–112)
Glucose, Bld: 83 mg/dL (ref 70–99)
Sodium: 141 mEq/L (ref 135–145)

## 2012-08-21 LAB — GLUCOSE, CAPILLARY: Glucose-Capillary: 76 mg/dL (ref 70–99)

## 2012-08-21 MED ORDER — FUROSEMIDE 40 MG PO TABS
40.0000 mg | ORAL_TABLET | Freq: Two times a day (BID) | ORAL | Status: DC
  Administered 2012-08-21 – 2012-08-24 (×6): 40 mg via ORAL
  Filled 2012-08-21 (×9): qty 1

## 2012-08-21 MED ORDER — ENSURE COMPLETE PO LIQD
237.0000 mL | Freq: Two times a day (BID) | ORAL | Status: DC
  Administered 2012-08-21 – 2012-08-24 (×5): 237 mL via ORAL

## 2012-08-21 NOTE — Progress Notes (Signed)
Patient ID: Jonathan Craig, male   DOB: [birth year], 78 y.o.   MRN: [id number]   SUBJECTIVE:  Physical therapy assessed the patient and was working with him. He is very weak. He has full-time help at home but he is not ready to go yet. I carefully reviewed his status with the nurses. He needs to remain in his current unit for pleased 1 more day.   Filed Vitals:   08/20/12 2015 08/20/12 2030 08/21/12 0000 08/21/12 0400  BP: 91/55  113/60 108/63  Pulse: 61  79 63  Temp:  97.3 F (36.3 C) 97.6 F (36.4 C) 97.4 F (36.3 C)  TempSrc:  Oral Oral Oral  Resp:  20 18 20   Height:      Weight:    138 lb 14.2 oz (63 kg)  SpO2: 97%  100% 96%    Intake/Output Summary (Last 24 hours) at 08/21/12 0715 Last data filed at 08/21/12 0600  Gross per 24 hour  Intake   1808 ml  Output   2425 ml  Net   -617 ml    LABS: Basic Metabolic Panel:  Basename 08/21/12 0420 08/20/12 0605 08/19/12 0630  NA 141 138 --  K 4.0 3.7 --  CL 100 97 --  CO2 38* 37* --  GLUCOSE 83 80 --  BUN 16 24* --  CREATININE 0.70 0.73 --  CALCIUM 9.0 9.1 --  MG -- -- 2.0  PHOS -- -- --   Liver Function Tests: No results found for this basename: AST:2,ALT:2,ALKPHOS:2,BILITOT:2,PROT:2,ALBUMIN:2 in the last 72 hours No results found for this basename: LIPASE:2,AMYLASE:2 in the last 72 hours CBC:  Basename 08/20/12 0605  WBC 10.2  NEUTROABS --  HGB 10.9*  HCT 34.3*  MCV 99.4  PLT 95*   Cardiac Enzymes: No results found for this basename: CKTOTAL:3,CKMB:3,CKMBINDEX:3,TROPONINI:3 in the last 72 hours BNP: No components found with this basename: POCBNP:3 D-Dimer: No results found for this basename: DDIMER:2 in the last 72 hours Hemoglobin A1C: No results found for this basename: HGBA1C in the last 72 hours Fasting Lipid Panel: No results found for this basename: CHOL,HDL,LDLCALC,TRIG,CHOLHDL,LDLDIRECT in the last 72 hours Thyroid Function Tests: No results found for this basename:  TSH,T4TOTAL,FREET3,T3FREE,THYROIDAB in the last 72 hours  RADIOLOGY: Dg Chest Port 1 View  08/19/2012  *RADIOLOGY REPORT*  Clinical Data: 78 year old male acute respiratory failure.  PORTABLE CHEST - 1 VIEW  Comparison: 08/17/2012 and earlier.  Findings: AP portable semi upright AP view 0710 hours.  Extubated. Enteric tube removed.  No central line identified.  Stable lung volumes.  Stable cardiac size and mediastinal contours. Continued patchy opacity at both lung bases.  No pneumothorax, edema or large effusion.  IMPRESSION: 1.  Extubated and enteric tube removed. 2.  Stable lung volumes and basilar hypoventilation.   Original Report Authenticated By: Harley Hallmark, M.D.    Portable Chest Xray In Am  08/17/2012  *RADIOLOGY REPORT*  Clinical Data: Follow up infiltrate, history hypertension, COPD, coronary disease, CHF  PORTABLE CHEST - 1 VIEW  Comparison: Portable exam 0532 hours compared to 08/16/2012  Findings: Tip of endotracheal tube 6.5 cm above carina. Nasogastric tube extends into abdomen. Cardiac monitor leads project over chest. Enlargement of cardiac silhouette. Atherosclerotic calcification aorta. Persistent atelectasis versus infiltrate at lung bases. Upper lungs clear. No pneumothorax.  IMPRESSION: Persistent bibasilar atelectasis versus infiltrate.   Original Report Authenticated By: Lollie Marrow, M.D.    Portable Chest Xray To Verify Ett/ Central Line Placement  08/16/2012  *RADIOLOGY  REPORT*  Clinical Data: Central line placement.  Ventilator dependent respiratory failure.  PORTABLE CHEST - 1 VIEW  Comparison: 08/16/2012  Findings: Endotracheal tube is seen with tip at the level of clavicles.  Nasogastric tube is seen entering the stomach.  No other central line identified, however there is no evidence of pneumothorax.  Cardiomegaly stable. Bibasilar atelectasis versus infiltrates again noted, and also without significant change.  IMPRESSION: Stable cardiomegaly and bibasilar atelectasis  versus infiltrates.   Original Report Authenticated By: Danae Orleans, M.D.    Ct Portable Head W/o Cm  08/16/2012  *RADIOLOGY REPORT*  Clinical Data: Altered mental status  CT HEAD WITHOUT CONTRAST  Technique:  Contiguous axial images were obtained from the base of the skull through the vertex without contrast.  Comparison: None.  Findings: Portable CT equipment was utilized.  The patient was agitated and did not hold still.  The first attempt is nondiagnostic.  A second   attempt was made following IV Versed.  There is extensive motion artifact on the second attempt.  There is limited information available.  There is generalized atrophy.  No hemorrhage or large mass is seen.  Acute infarct cannot be evaluated on this study.  IMPRESSION: The patient could not hold still.  The study has very limited diagnostic information.  No acute hemorrhage is identified.   Original Report Authenticated By: Camelia Phenes, M.D.     PHYSICAL EXAM   Patient is comfortable lying flat in bed. Family members in the room. He continues to have some mild confusion. There is no jugulovenous distention. Lungs reveal scattered rhonchi. Cardiac exam reveals S1 and S2. There no clicks or significant murmurs. The abdomen is soft. He does not have any significant peripheral edema at this time.  TELEMETRY:  I have reviewed telemetry today August 21, 2012. There is normal sinus rhythm.   ASSESSMENT AND PLAN:  Active Problems:   Hypokalemia    Potassium continues to improve on his current dosing. I will consider cutting back the dose tomorrow.   Renal insufficiency    Renal function is remaining stable. He diuresis 600 cc yesterday.   RBBB  Acute on chronic systolic CHF (congestive heart failure)      His CHF appears to be well compensated at this point. I will continue the current medications.   Acute respiratory failure with hypoxia    He is significantly improved.   Prolonged QT interval  NSTEMI (non-ST elevated  myocardial infarction)    Medical therapy is in order.   Mental status alteration /  Overall weakness.    Patient was seen by physical therapy yesterday. I have ordered occupational therapy for today. He has a very supportive family and the plan is for him eventually to go home. He is definitely too weak at this point. His care requires that he stay in the current unit today. We will reassess this tomorrow.   Willa Rough 08/21/2012 7:15 AM

## 2012-08-21 NOTE — Progress Notes (Signed)
Physical Therapy Treatment Patient Details Name: Jonathan Craig MRN: [id number] DOB: 03/07/1924 Today's Date: 08/21/2012 Time: 8657-8469 PT Time Calculation (min): 23 min  PT Assessment / Plan / Recommendation Comments on Treatment Session  Pt admitted with respiratory failure and continues to progress with therapy.  Pt able to tolerate increased ambulation distance today.  Limited by fatigue.    Follow Up Recommendations  Home health PT;Supervision/Assistance - 24 hour    Barriers to Discharge        Equipment Recommendations  Other (comment) (Daughter interested in 4 wheeled walker, but may be unsafe.)    Recommendations for Other Services OT consult  Frequency Min 3X/week   Plan Discharge plan remains appropriate;Frequency remains appropriate    Precautions / Restrictions Precautions Precautions: Fall Restrictions Weight Bearing Restrictions: No   Pertinent Vitals/Pain None    Mobility  Bed Mobility Bed Mobility: Supine to Sit Supine to Sit: 4: Min assist Details for Bed Mobility Assistance: Assist for trunk to translate anterior with cues for sequence. Transfers Transfers: Sit to Stand;Stand to Sit (2 trials.) Sit to Stand: 1: +2 Total assist;With upper extremity assist;From bed Sit to Stand: Patient Percentage: 50% Stand to Sit: 1: +2 Total assist;With upper extremity assist;To chair/3-in-1;To bed Stand to Sit: Patient Percentage: 70% Details for Transfer Assistance: Assist for balance and to translate trunk anterior/superior with cues for safest hand placement.   Ambulation/Gait Ambulation/Gait Assistance: 1: +2 Total assist Ambulation/Gait: Patient Percentage: 60% Ambulation Distance (Feet): 45 Feet Assistive device: Rolling walker Ambulation/Gait Assistance Details: Assist for balance with cues for tall posture and safety inside RW.  Pt with bilateral knee flexion during stance phases with increased flexion with fatigue. Gait Pattern: Decreased stride  length;Right flexed knee in stance;Left flexed knee in stance;Trunk flexed;Narrow base of support Stairs: No Wheelchair Mobility Wheelchair Mobility: No    Exercises     PT Diagnosis:    PT Problem List:   PT Treatment Interventions:     PT Goals Acute Rehab PT Goals PT Goal Formulation: With patient Time For Goal Achievement: 09/03/12 Potential to Achieve Goals: Good PT Goal: Supine/Side to Sit - Progress: Progressing toward goal PT Goal: Sit to Stand - Progress: Progressing toward goal PT Goal: Stand to Sit - Progress: Progressing toward goal PT Goal: Ambulate - Progress: Progressing toward goal  Visit Information  Last PT Received On: 08/21/12 Assistance Needed: +2    Subjective Data  Subjective: "I will try the best I can." Patient Stated Goal: return home with wife and aides   Cognition  Overall Cognitive Status: Impaired Area of Impairment: Memory Arousal/Alertness: Awake/alert Orientation Level: Place;Time;Situation Behavior During Session: WFL for tasks performed    Balance  Balance Balance Assessed: No  End of Session PT - End of Session Equipment Utilized During Treatment: Gait belt Activity Tolerance: Patient tolerated treatment well;Patient limited by fatigue Patient left: in chair;with call bell/phone within reach;with family/visitor present Nurse Communication: Mobility status   GP     Cephus Shelling 08/21/2012, 11:08 AM  08/21/2012 Cephus Shelling, PT, DPT [phone]

## 2012-08-22 DIAGNOSIS — T148XXA Other injury of unspecified body region, initial encounter: Secondary | ICD-10-CM | POA: Diagnosis not present

## 2012-08-22 LAB — CULTURE, BLOOD (ROUTINE X 2): Culture: NO GROWTH

## 2012-08-22 LAB — BASIC METABOLIC PANEL
CO2: 34 mEq/L — ABNORMAL HIGH (ref 19–32)
Chloride: 98 mEq/L (ref 96–112)
GFR calc Af Amer: >90 mL/min (ref >90)
Potassium: 4.5 mEq/L (ref 3.5–5.1)
Sodium: 135 mEq/L (ref 135–145)

## 2012-08-22 MED ORDER — POTASSIUM CHLORIDE CRYS ER 20 MEQ PO TBCR
20.0000 meq | EXTENDED_RELEASE_TABLET | Freq: Every day | ORAL | Status: DC
  Administered 2012-08-22 – 2012-08-24 (×3): 20 meq via ORAL
  Filled 2012-08-22 (×2): qty 1

## 2012-08-22 NOTE — Progress Notes (Signed)
Patient ID: Jonathan Craig, male   DOB: 1924-11-06, 78 y.o.   MRN: [id number]   SUBJECTIVE:  Patient continues to slowly improve. However he is still quite weak. He is still having some confusion at nighttime.   Filed Vitals:   08/21/12 2325 08/22/12 0412 08/22/12 0500 08/22/12 0745  BP: 100/51 95/54  100/56  Pulse: 75   85  Temp: 98.3 F (36.8 C) 98.5 F (36.9 C)  97.5 F (36.4 C)  TempSrc: Oral Oral  Oral  Resp: 16 16  17   Height:      Weight:   143 lb 11.8 oz (65.2 kg)   SpO2: 96% 95%  94%    Intake/Output Summary (Last 24 hours) at 08/22/12 0811 Last data filed at 08/22/12 0500  Gross per 24 hour  Intake   1373 ml  Output   1250 ml  Net    123 ml    LABS: Basic Metabolic Panel:  Basename 08/22/12 0514 08/21/12 0420  NA 135 141  K 4.5 4.0  CL 98 100  CO2 34* 38*  GLUCOSE 86 83  BUN 16 16  CREATININE 0.76 0.70  CALCIUM 9.1 9.0  MG -- --  PHOS -- --   Liver Function Tests: No results found for this basename: AST:2,ALT:2,ALKPHOS:2,BILITOT:2,PROT:2,ALBUMIN:2 in the last 72 hours No results found for this basename: LIPASE:2,AMYLASE:2 in the last 72 hours CBC:  Basename 08/20/12 0605  WBC 10.2  NEUTROABS --  HGB 10.9*  HCT 34.3*  MCV 99.4  PLT 95*   Cardiac Enzymes: No results found for this basename: CKTOTAL:3,CKMB:3,CKMBINDEX:3,TROPONINI:3 in the last 72 hours BNP: No components found with this basename: POCBNP:3 D-Dimer: No results found for this basename: DDIMER:2 in the last 72 hours Hemoglobin A1C: No results found for this basename: HGBA1C in the last 72 hours Fasting Lipid Panel: No results found for this basename: CHOL,HDL,LDLCALC,TRIG,CHOLHDL,LDLDIRECT in the last 72 hours Thyroid Function Tests: No results found for this basename: TSH,T4TOTAL,FREET3,T3FREE,THYROIDAB in the last 72 hours  RADIOLOGY: Dg Chest Port 1 View  08/19/2012  *RADIOLOGY REPORT*  Clinical Data: 78 year old male acute respiratory failure.  PORTABLE CHEST - 1 VIEW   Comparison: 08/17/2012 and earlier.  Findings: AP portable semi upright AP view 0710 hours.  Extubated. Enteric tube removed.  No central line identified.  Stable lung volumes.  Stable cardiac size and mediastinal contours. Continued patchy opacity at both lung bases.  No pneumothorax, edema or large effusion.  IMPRESSION: 1.  Extubated and enteric tube removed. 2.  Stable lung volumes and basilar hypoventilation.   Original Report Authenticated By: Harley Hallmark, M.D.    Portable Chest Xray In Am  08/17/2012  *RADIOLOGY REPORT*  Clinical Data: Follow up infiltrate, history hypertension, COPD, coronary disease, CHF  PORTABLE CHEST - 1 VIEW  Comparison: Portable exam 0532 hours compared to 08/16/2012  Findings: Tip of endotracheal tube 6.5 cm above carina. Nasogastric tube extends into abdomen. Cardiac monitor leads project over chest. Enlargement of cardiac silhouette. Atherosclerotic calcification aorta. Persistent atelectasis versus infiltrate at lung bases. Upper lungs clear. No pneumothorax.  IMPRESSION: Persistent bibasilar atelectasis versus infiltrate.   Original Report Authenticated By: Lollie Marrow, M.D.    Portable Chest Xray To Verify Ett/ Central Line Placement  08/16/2012  *RADIOLOGY REPORT*  Clinical Data: Central line placement.  Ventilator dependent respiratory failure.  PORTABLE CHEST - 1 VIEW  Comparison: 08/16/2012  Findings: Endotracheal tube is seen with tip at the level of clavicles.  Nasogastric tube is seen entering the  stomach.  No other central line identified, however there is no evidence of pneumothorax.  Cardiomegaly stable. Bibasilar atelectasis versus infiltrates again noted, and also without significant change.  IMPRESSION: Stable cardiomegaly and bibasilar atelectasis versus infiltrates.   Original Report Authenticated By: Danae Orleans, M.D.    Ct Portable Head W/o Cm  08/16/2012  *RADIOLOGY REPORT*  Clinical Data: Altered mental status  CT HEAD WITHOUT CONTRAST  Technique:   Contiguous axial images were obtained from the base of the skull through the vertex without contrast.  Comparison: None.  Findings: Portable CT equipment was utilized.  The patient was agitated and did not hold still.  The first attempt is nondiagnostic.  A second   attempt was made following IV Versed.  There is extensive motion artifact on the second attempt.  There is limited information available.  There is generalized atrophy.  No hemorrhage or large mass is seen.  Acute infarct cannot be evaluated on this study.  IMPRESSION: The patient could not hold still.  The study has very limited diagnostic information.  No acute hemorrhage is identified.   Original Report Authenticated By: Camelia Phenes, M.D.     PHYSICAL EXAM   Patient is oriented to person and place. There is a family member in the room. He is lying flat in bed and comfortable. He has some bruising on his arms with ecchymoses. Lung exam reveal scattered rhonchi. Cardiac exam reveals S1 and S2. There no clicks or significant murmurs. The abdomen is soft. There is no significant peripheral edema.   TELEMETRY: I reviewed telemetry today August 22, 2012. There is normal sinus rhythm.   ASSESSMENT AND PLAN:  Active Problems:   Hypokalemia    Potassium is up to 4.5. It is going up. I will cut back now on his potassium dosing.   Renal insufficiency    Renal function is stable on the current medications. No change in therapy.   RBBB  Acute on chronic systolic CHF (congestive heart failure)    The patient's congestive heart failure has stabilized. He has just been started on oral diuretics after his IV meds. I&O will be followed. At this point I will not try to titrate his other medicines further. His blood pressure is borderline low.   Acute respiratory failure with hypoxia     Improved   Prolonged QT interval   NSTEMI (non-ST elevated myocardial infarction) Medical therapy of coronary disease.       Mental status  alteration    This is slowly improving. Continue PT and OT. The plan is for the patient to go home when he is strong enough and stable. He is stable enough to be moved to a regular telemetry floor today.   Bruising   Patient has some ecchymoses on his arms. He has been on DVT dose Lovenox. He also has SCD's in place on his legs. He is being ambulated. I have stopped his Lovenox.   Willa Rough 08/22/2012 8:11 AM

## 2012-08-22 NOTE — Evaluation (Signed)
Occupational Therapy Evaluation Patient Details Name: Jonathan Craig MRN: [id number] DOB: November 17, 1924 Today's Date: 08/22/2012 Time: 6213-0865 OT Time Calculation (min): 25 min  OT Assessment / Plan / Recommendation Clinical Impression  78 yo adm with respiratory failure and spent 2 days on ventilator. Pt presents with generalized weakness and decreased ROM limiting I with ADL. Pt will benefit from skilled OT in the acute setting to maximize I with ADL and ADL mobility prior to d/c.     OT Assessment  Patient needs continued OT Services    Follow Up Recommendations  Home health OT;Supervision/Assistance - 24 hour    Barriers to Discharge      Equipment Recommendations   (TBD)    Recommendations for Other Services    Frequency  Min 2X/week    Precautions / Restrictions Precautions Precautions: Fall Restrictions Weight Bearing Restrictions: No   Pertinent Vitals/Pain Pt with no c/o pain this session    ADL  Grooming: Performed;Wash/dry face;Minimal assistance Where Assessed - Grooming: Supine, head of bed up Upper Body Dressing: Simulated;Min guard Where Assessed - Upper Body Dressing: Supported sitting Lower Body Dressing: Simulated;+1 Total assistance Where Assessed - Lower Body Dressing: Rolling right and/or left;Supine, head of bed flat Toilet Transfer: Performed;+2 Total assistance Toilet Transfer: Patient Percentage: 60% Toilet Transfer Method: Stand pivot Acupuncturist: Materials engineer and Hygiene: Performed;+1 Total assistance Where Assessed - Engineer, mining and Hygiene: Sit to stand from 3-in-1 or toilet ADL Comments: Pt very fatigued this session    OT Diagnosis: Generalized weakness;Acute pain  OT Problem List: Decreased strength;Decreased activity tolerance;Decreased range of motion;Impaired balance (sitting and/or standing);Decreased safety awareness;Decreased knowledge of use of DME or  AE;Decreased knowledge of precautions;Pain;Impaired UE functional use;Cardiopulmonary status limiting activity OT Treatment Interventions: Self-care/ADL training;DME and/or AE instruction;Therapeutic activities;Balance training;Patient/family education   OT Goals Acute Rehab OT Goals OT Goal Formulation: With patient Time For Goal Achievement: 08/29/12 Potential to Achieve Goals: Good ADL Goals Pt Will Perform Grooming: with supervision;with set-up;Sitting, chair;Sitting, edge of bed ADL Goal: Grooming - Progress: Goal set today Pt Will Perform Upper Body Bathing: with supervision;with set-up;Sitting, chair;Sitting, edge of bed ADL Goal: Upper Body Bathing - Progress: Goal set today Pt Will Perform Lower Body Bathing: with mod assist;Sit to stand from bed;Sit to stand from chair ADL Goal: Lower Body Bathing - Progress: Goal set today Pt Will Transfer to Toilet: with min assist;Ambulation;with DME ADL Goal: Toilet Transfer - Progress: Goal set today Pt Will Perform Toileting - Clothing Manipulation: with min assist;Standing ADL Goal: Toileting - Clothing Manipulation - Progress: Goal set today Pt Will Perform Toileting - Hygiene: with min assist;Sit to stand from 3-in-1/toilet ADL Goal: Toileting - Hygiene - Progress: Goal set today  Visit Information  Last OT Received On: 08/22/12 Assistance Needed: +2    Subjective Data  Subjective: I went for accupuncture in this shoulder (right) Patient Stated Goal: Return home   Prior Functioning  Vision/Perception  Home Living Lives With: Spouse Available Help at Discharge: Family;Personal care attendant;Available 24 hours/day Type of Home: House Home Access: Stairs to enter [employer] of Steps: 2 Entrance Stairs-Rails: None Home Layout: Two level;Laundry or work area in Artist of Steps: 11 Alternate Level Stairs-Rails: Right Bathroom Accessibility: Yes How Accessible: Accessible via  walker Home Adaptive Equipment: Walker - standard;Straight cane;Other (comment) Additional Comments: wife has 4wheel walker  Prior Function Level of Independence: Needs assistance Needs Assistance: Light Housekeeping Able to Take Stairs?: Yes Communication  Communication: No difficulties Dominant Hand: Right      Cognition  Overall Cognitive Status: Appears within functional limits for tasks assessed/performed Arousal/Alertness: Awake/alert Orientation Level: Oriented X4 / Intact Behavior During Session: [facility] for tasks performed    Extremity/Trunk Assessment Right Upper Extremity Assessment RUE ROM/Strength/Tone Deficits: 2/5 shoulder flexion in supine; elbow and distally 4/5 also with poor endurance RUE Sensation: WFL - Light Touch RUE Coordination: WFL - gross/fine motor (within limits of arthritis) Left Upper Extremity Assessment LUE ROM/Strength/Tone Deficits: AAROM WFL; strength shoulder 3-/5; elbow 4/5 LUE Sensation: WFL - Light Touch LUE Coordination: WFL - gross/fine motor (within limits of arthritis)   Mobility  Shoulder Instructions  Bed Mobility Bed Mobility: Sit to Supine Sit to Supine: 2: Max assist;HOB flat;With rail Details for Bed Mobility Assistance: assist with trunk and bil LE Transfers Sit to Stand: 1: +2 Total assist;With upper extremity assist;From bed Sit to Stand: Patient Percentage: 50% Stand to Sit: 1: +2 Total assist;With upper extremity assist;To chair/3-in-1;To bed Stand to Sit: Patient Percentage: 70% Details for Transfer Assistance: Assist for balance and to translate trunk anterior/superior with cues for safest hand placement.         Exercise     Balance     End of Session OT - End of Session Equipment Utilized During Treatment: Gait belt Activity Tolerance: Patient tolerated treatment well Patient left: in bed;with call bell/phone within reach;with family/visitor present Nurse Communication: Mobility status  GO      Mollyann Halbert 08/22/2012, 3:58 PM

## 2012-08-22 NOTE — Progress Notes (Signed)
Pt to be transferred to room 3011.  Report called to [place], Charity fundraiser.  Will continue to monitor.

## 2012-08-23 LAB — BASIC METABOLIC PANEL
CO2: 34 mEq/L — ABNORMAL HIGH (ref 19–32)
Chloride: 98 mEq/L (ref 96–112)
Glucose, Bld: 108 mg/dL — ABNORMAL HIGH (ref 70–99)
Potassium: 4.2 mEq/L (ref 3.5–5.1)
Sodium: 139 mEq/L (ref 135–145)

## 2012-08-23 NOTE — Progress Notes (Signed)
OT Cancellation Note  Treatment cancelled today due to patient's refusal to participate . Pt refusing and requesting Ot to return at 5pm or 6pm. Pt educated on therapy working hours for acute rehab department. Pt does not have pain but "just rather wait , maybe tomorrow"  Lucile Shutters Pager: [phone]  08/23/2012, 2:16 PM

## 2012-08-23 NOTE — Progress Notes (Signed)
Physical Therapy Treatment Patient Details Name: Jonathan Craig MRN: [id number] DOB: Jun 06, 1924 Today's Date: 08/23/2012 Time: 7829-5621 PT Time Calculation (min): 24 min  PT Assessment / Plan / Recommendation Comments on Treatment Session  pt rpesents with Resp Failure.  pt moving better today and did not require 2nd person for safety.  pt needs safety cues for use of 4WW.      Follow Up Recommendations  Home health PT;Supervision/Assistance - 24 hour    Barriers to Discharge        Equipment Recommendations  None recommended by PT    Recommendations for Other Services    Frequency Min 3X/week   Plan Discharge plan remains appropriate;Frequency remains appropriate    Precautions / Restrictions Precautions Precautions: Fall Restrictions Weight Bearing Restrictions: No   Pertinent Vitals/Pain Denies pain.      Mobility  Bed Mobility Bed Mobility: Supine to Sit;Sitting - Scoot to [place] of Bed;Sit to Supine Supine to Sit: 4: Min assist Sitting - Scoot to Edge of Bed: 5: Supervision Sit to Supine: 4: Min assist Transfers Transfers: Sit to Stand;Stand to Sit Sit to Stand: 4: Min assist;With upper extremity assist;From bed Stand to Sit: 4: Min assist;With upper extremity assist;To bed Details for Transfer Assistance: cues for use of UEs, use of brakes on 4WW, controlling descent to chair.   Ambulation/Gait Ambulation/Gait Assistance: 4: Min assist Ambulation Distance (Feet): 100 Feet Assistive device: 4-wheeled walker Ambulation/Gait Assistance Details: cues for safety and use of new 4WW, upright posture, positioning with RW.   Gait Pattern: Decreased stride length;Right flexed knee in stance;Left flexed knee in stance;Trunk flexed;Narrow base of support Stairs: No Wheelchair Mobility Wheelchair Mobility: No    Exercises     PT Diagnosis:    PT Problem List:   PT Treatment Interventions:     PT Goals Acute Rehab PT Goals Time For Goal Achievement: 09/03/12 PT  Goal: Supine/Side to Sit - Progress: Progressing toward goal PT Goal: Sit to Supine/Side - Progress: Progressing toward goal PT Goal: Sit to Stand - Progress: Progressing toward goal PT Goal: Stand to Sit - Progress: Progressing toward goal PT Goal: Ambulate - Progress: Progressing toward goal  Visit Information  Last PT Received On: 08/23/12 Assistance Needed: +1    Subjective Data  Subjective: So you want me to test out my new ride? -pt indicating new 4WW.     Cognition  Overall Cognitive Status: Appears within functional limits for tasks assessed/performed Arousal/Alertness: Awake/alert Orientation Level: Oriented X4 / Intact Behavior During Session: [facility] for tasks performed    Balance  Balance Balance Assessed: No  End of Session PT - End of Session Equipment Utilized During Treatment: Gait belt Activity Tolerance: Patient tolerated treatment well Patient left: in bed;with call bell/phone within reach;with family/visitor present Nurse Communication: Mobility status   GP     Sunny Schlein, [place] [phone] 08/23/2012, 12:28 PM

## 2012-08-23 NOTE — Progress Notes (Signed)
Spoke with Jonathan Craig about pts meds; pts BP 106/58, told to give pt lasix first and then recheck pts BP; if BP within parameters then give coreg as well

## 2012-08-23 NOTE — Progress Notes (Signed)
Pts BP this AM 80s/40s, pt asymptomatic; held coreg and lasix this am; Janell Quiet paged and made aware of low BP; BP did come up to 110/40s, gave lisinopril and aldactone; will continue to monitor

## 2012-08-23 NOTE — Progress Notes (Addendum)
Rec'd a call from RN re: low BP. SBP 88, he feels a little unsteady on his feet, but is being seen by PT/OT. No syncope/presyncope.  Spoke with Dr Myrtis Ser, will hold lisinopril, continue other meds with parameters. Try not to hold diuretics, if possible.  Spoke with RN and patient this pm. His BP is improved, but most meds were held this am - he rec'd only lisinopril and Aldactone. SBP currently 106. Requested she give the Lasix now and the Coreg this pm. Continue to follow closely.   Patient still feels weak, agrees to stay till am. Hopefully he will stabilize and be OK for d/c tomorrow.   Bjorn Loser Shaina Gullatt 08/23/2012 11:13 AM

## 2012-08-23 NOTE — Progress Notes (Signed)
Patient ID: Jonathan Craig, male   DOB: [birth year], 78 y.o.   MRN: [id number]   SUBJECTIVE:   The patient is feeling better. He is definitely oriented to person and place. He has a family member in the room. His activities are increasing. He is still weak but getting better.  Filed Vitals:   08/22/12 1711 08/22/12 2100 08/23/12 0500 08/23/12 0739  BP: 102/66 112/62 89/49 88/40   Pulse:  78 56 67  Temp:  97.5 F (36.4 C) 98 F (36.7 C)   TempSrc:      Resp:  20 18   Height:      Weight:      SpO2:  96% 97%     Intake/Output Summary (Last 24 hours) at 08/23/12 0749 Last data filed at 08/22/12 2100  Gross per 24 hour  Intake    240 ml  Output    700 ml  Net   -460 ml    LABS: Basic Metabolic Panel:  Basename 08/23/12 0556 08/22/12 0514  NA 139 135  K 4.2 4.5  CL 98 98  CO2 34* 34*  GLUCOSE 108* 86  BUN 15 16  CREATININE 0.78 0.76  CALCIUM 9.4 9.1  MG -- --  PHOS -- --   Liver Function Tests: No results found for this basename: AST:2,ALT:2,ALKPHOS:2,BILITOT:2,PROT:2,ALBUMIN:2 in the last 72 hours No results found for this basename: LIPASE:2,AMYLASE:2 in the last 72 hours CBC: No results found for this basename: WBC:2,NEUTROABS:2,HGB:2,HCT:2,MCV:2,PLT:2 in the last 72 hours Cardiac Enzymes: No results found for this basename: CKTOTAL:3,CKMB:3,CKMBINDEX:3,TROPONINI:3 in the last 72 hours BNP: No components found with this basename: POCBNP:3 D-Dimer: No results found for this basename: DDIMER:2 in the last 72 hours Hemoglobin A1C: No results found for this basename: HGBA1C in the last 72 hours Fasting Lipid Panel: No results found for this basename: CHOL,HDL,LDLCALC,TRIG,CHOLHDL,LDLDIRECT in the last 72 hours Thyroid Function Tests: No results found for this basename: TSH,T4TOTAL,FREET3,T3FREE,THYROIDAB in the last 72 hours  RADIOLOGY: Dg Chest Port 1 View  08/19/2012  *RADIOLOGY REPORT*  Clinical Data: 78 year old male acute respiratory failure.  PORTABLE CHEST -  1 VIEW  Comparison: 08/17/2012 and earlier.  Findings: AP portable semi upright AP view 0710 hours.  Extubated. Enteric tube removed.  No central line identified.  Stable lung volumes.  Stable cardiac size and mediastinal contours. Continued patchy opacity at both lung bases.  No pneumothorax, edema or large effusion.  IMPRESSION: 1.  Extubated and enteric tube removed. 2.  Stable lung volumes and basilar hypoventilation.   Original Report Authenticated By: Harley Hallmark, M.D.    Portable Chest Xray In Am  08/17/2012  *RADIOLOGY REPORT*  Clinical Data: Follow up infiltrate, history hypertension, COPD, coronary disease, CHF  PORTABLE CHEST - 1 VIEW  Comparison: Portable exam 0532 hours compared to 08/16/2012  Findings: Tip of endotracheal tube 6.5 cm above carina. Nasogastric tube extends into abdomen. Cardiac monitor leads project over chest. Enlargement of cardiac silhouette. Atherosclerotic calcification aorta. Persistent atelectasis versus infiltrate at lung bases. Upper lungs clear. No pneumothorax.  IMPRESSION: Persistent bibasilar atelectasis versus infiltrate.   Original Report Authenticated By: Lollie Marrow, M.D.    Portable Chest Xray To Verify Ett/ Central Line Placement  08/16/2012  *RADIOLOGY REPORT*  Clinical Data: Central line placement.  Ventilator dependent respiratory failure.  PORTABLE CHEST - 1 VIEW  Comparison: 08/16/2012  Findings: Endotracheal tube is seen with tip at the level of clavicles.  Nasogastric tube is seen entering the stomach.  No other central line  identified, however there is no evidence of pneumothorax.  Cardiomegaly stable. Bibasilar atelectasis versus infiltrates again noted, and also without significant change.  IMPRESSION: Stable cardiomegaly and bibasilar atelectasis versus infiltrates.   Original Report Authenticated By: Danae Orleans, M.D.    Ct Portable Head W/o Cm  08/16/2012  *RADIOLOGY REPORT*  Clinical Data: Altered mental status  CT HEAD WITHOUT CONTRAST   Technique:  Contiguous axial images were obtained from the base of the skull through the vertex without contrast.  Comparison: None.  Findings: Portable CT equipment was utilized.  The patient was agitated and did not hold still.  The first attempt is nondiagnostic.  A second   attempt was made following IV Versed.  There is extensive motion artifact on the second attempt.  There is limited information available.  There is generalized atrophy.  No hemorrhage or large mass is seen.  Acute infarct cannot be evaluated on this study.  IMPRESSION: The patient could not hold still.  The study has very limited diagnostic information.  No acute hemorrhage is identified.   Original Report Authenticated By: Camelia Phenes, M.D.     PHYSICAL EXAM  Patient is oriented to person and place. He is stable in bed. There is no jugulovenous distention. Lungs are clear. Respiratory effort is nonlabored. Cardiac exam reveals S1 and S2. There no clicks or significant murmurs. The abdomen is soft. There is no peripheral edema.  TELEMETRY: I have reviewed telemetry today August 23, 2012. There is normal sinus rhythm.   ASSESSMENT AND PLAN:   Hypokalemia   Potassium is stable on the current dosing.   Renal insufficiency    Renal function is stable on the current medications.   RBBB  Acute on chronic systolic CHF (congestive heart failure)   Volume status is stabilized. He is stable on his current medications.   Acute respiratory failure with hypoxia   This is improved since admission.   NSTEMI (non-ST elevated myocardial infarction)    Medical therapy is recommended.   Mental status alteration    Mental status continues to improve slowly. I appreciate greatly also notes from physical therapy and occupational therapy. I have tried to order outpatient physical therapy as requested. I could not find the exact order but I did put an order in with an explanation. We will continue to work with physical therapy and  occupational therapy to see when the patient has reached maximum hospital benefit. Hopefully they will be able to get him home.    Bruising   Lovenox was stopped yesterday.   The plan is as outlined above. As soon as he is strong enough and able from the viewpoint of physical therapy and outpatient therapy, he can go home with outpatient therapy to stay with his attentive family.   Willa Rough 08/23/2012 7:49 AM

## 2012-08-24 DIAGNOSIS — R5381 Other malaise: Secondary | ICD-10-CM

## 2012-08-24 LAB — BASIC METABOLIC PANEL
BUN: 17 mg/dL (ref 6–23)
Chloride: 102 mEq/L (ref 96–112)
Glucose, Bld: 85 mg/dL (ref 70–99)
Potassium: 4.1 mEq/L (ref 3.5–5.1)
Sodium: 138 mEq/L (ref 135–145)

## 2012-08-24 MED ORDER — CARVEDILOL 3.125 MG PO TABS: 3.1250 mg | ORAL_TABLET | Freq: Two times a day (BID) | ORAL | Status: DC

## 2012-08-24 MED ORDER — FUROSEMIDE 40 MG PO TABS: 40.0000 mg | ORAL_TABLET | Freq: Two times a day (BID) | ORAL | Status: DC

## 2012-08-24 MED ORDER — ENSURE COMPLETE PO LIQD: 237.0000 mL | Freq: Two times a day (BID) | ORAL | Status: DC
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
## 2012-08-24 NOTE — Discharge Summary (Signed)
Pt d/c home with family per w/c. Belongings with pt, tele box d/c'd, iv d/c'd. Discharge summary complete and pt and family verbalize understanding. Prescriptions with family.

## 2012-08-24 NOTE — Progress Notes (Signed)
Patient Name: Jonathan Craig Date of Encounter: 08/24/2012  Active Problems:  Hypokalemia  Renal insufficiency  RBBB  Acute on chronic systolic CHF (congestive heart failure)  Acute respiratory failure with hypoxia  Prolonged QT interval  NSTEMI (non-ST elevated myocardial infarction)  Mental status alteration  QT prolongation  Bruising   LOS: 8  SUBJECTIVE: Feels OK today, lives with wife and has caregivers. Wants to go home.  OBJECTIVE Filed Vitals:   08/23/12 1632 08/23/12 1700 08/23/12 2100 08/24/12 0500  BP: 106/58 100/48 92/44 99/58   Pulse: 75 72 68 93  Temp: 98 F (36.7 C)  98.6 F (37 C) 98.1 F (36.7 C)  TempSrc: Oral  Oral Oral  Resp: 18  18 18   Height:      Weight:    148 lb 9.6 oz (67.405 kg)  SpO2: 100%  96% 99%    Intake/Output Summary (Last 24 hours) at 08/24/12 0800 Last data filed at 08/24/12 0500  Gross per 24 hour  Intake    240 ml  Output    993 ml  Net   -753 ml   Filed Weights   08/21/12 0400 08/22/12 0500 08/24/12 0500  Weight: 138 lb 14.2 oz (63 kg) 143 lb 11.8 oz (65.2 kg) 148 lb 9.6 oz (67.405 kg)    PHYSICAL EXAM General: Well developed, slender, elderly male in no acute distress. Head: Normocephalic, atraumatic.  Neck: Supple without bruits, JVD at about 10 cm. Lungs:  Resp regular and unlabored, dense rales left > right, some crackles. Heart: RRR, S1, S2, no S3, S4, 2/6 murmur. Abdomen: Soft, non-tender, non-distended, BS + x 4.  Extremities: No clubbing, cyanosis, no edema.  Neuro: Alert and oriented X 3. Moves all extremities spontaneously. Psych: Normal affect.  LABS: CBC: Lab Results  Component Value Date   WBC 10.2 08/20/2012   HGB 10.9* 08/20/2012   HCT 34.3* 08/20/2012   MCV 99.4 08/20/2012   PLT 95* 08/20/2012    Basic Metabolic Panel: Basename 08/24/12 0627 08/23/12 0556  NA 138 139  K 4.1 4.2  CL 102 98  CO2 30 34*  GLUCOSE 85 108*  BUN 17 15  CREATININE 0.73 0.78  CALCIUM 8.8 9.4  MG -- --  PHOS -- --    Magnesium  Date Value Range Status  08/19/2012 2.0  1.5 - 2.5 mg/dL Final   BNP: Pro B Natriuretic peptide (BNP)  Date/Time Value Range Status  08/18/2012  5:45 AM 14966.0* 0 - 450 pg/mL Final  08/16/2012  1:01 PM 26636.0* 0 - 450 pg/mL Final   TELE:   SR, PVCs, pairs and some bigeminy  Radiology/Studies: Dg Chest Port 1 View 08/19/2012  *RADIOLOGY REPORT*  Clinical Data: 78 year old male acute respiratory failure.  PORTABLE CHEST - 1 VIEW  Comparison: 08/17/2012 and earlier.  Findings: AP portable semi upright AP view 0710 hours.  Extubated. Enteric tube removed.  No central line identified.  Stable lung volumes.  Stable cardiac size and mediastinal contours. Continued patchy opacity at both lung bases.  No pneumothorax, edema or large effusion.  IMPRESSION: 1.  Extubated and enteric tube removed. 2.  Stable lung volumes and basilar hypoventilation.   Original Report Authenticated By: Harley Hallmark, M.D.      Current Medications:     . antiseptic oral rinse  15 mL Mouth Rinse QID  . aspirin  325 mg Oral Daily  . carvedilol  3.125 mg Oral BID WC  . feeding supplement  237 mL Oral BID  BM  . furosemide  40 mg Oral BID  . potassium chloride  20 mEq Oral Daily  . sodium chloride  3 mL Intravenous Q12H  . spironolactone  25 mg Oral Daily  . DISCONTD: lisinopril  2.5 mg Oral Daily      . sodium chloride      ASSESSMENT AND PLAN: 78 year old male with Hx NICM went to [facility] with SOB, seen there by cards on 9/3, transferred to [facility] on 9/5 on the vent and DBA for hypotension. Ruled in for NSTEMI by ez, felt type 2, not cathed. Extubated 9/7. Slow improvement, getting close to d/c.  Active Problems:  Acute on chronic systolic CHF (congestive heart failure)/ Acute respiratory failure with hypoxia - Lasix is now oral. EF is 25%, on Lasix, aldactone and Coreg. Lisinopril d/c'd secondary to hypotension.   Hypotension - Ongoing problem, he has underlying weakness and probably need to keep SBP  approx 100 on Rx.   Hypokalemia - supp and improved, JA rec K+ 4.0 with long QT   Renal insufficiency - stable   RBBB/ Prolonged QT interval - follow   NSTEMI (non-ST elevated myocardial infarction) - type 2, med Rx, on ASA   Mental status alteration - improved off sedation   Bruising - secondary to blood draws, etc.  Deconditioning - PT/OT seeing, continue at home  Plan - d/c when medically stable.  Signed, Theodore Demark , PA-C 8:00 AM 08/24/2012 Patient examined and agree except changes made.  Valera Castle, MD 08/24/2012 8:36 AM

## 2012-08-24 NOTE — Progress Notes (Signed)
Physical Therapy Treatment Patient Details Name: Jonathan Craig MRN: [id number] DOB: Aug 02, 1924 Today's Date: 08/24/2012 Time: 4696-2952 PT Time Calculation (min): 25 min  PT Assessment / Plan / Recommendation Comments on Treatment Session  Pt admitted with respiratory failure and is able to continue to increase ambulation distance/activitiy tolerance.  Very motivated.    Follow Up Recommendations  Outpatient PT;Supervision/Assistance - 24 hour    Barriers to Discharge        Equipment Recommendations  None recommended by PT    Recommendations for Other Services    Frequency Min 3X/week   Plan Frequency remains appropriate;Discharge plan needs to be updated    Precautions / Restrictions Precautions Precautions: Fall Restrictions Weight Bearing Restrictions: No   Pertinent Vitals/Pain None    Mobility  Bed Mobility Bed Mobility: Not assessed Transfers Transfers: Sit to Stand;Stand to Sit (8 trials.) Sit to Stand: 4: Min assist;With upper extremity assist;From chair/3-in-1 (Progressed to min (guard) with repetition.) Stand to Sit: 4: Min assist;With upper extremity assist;To chair/3-in-1 (Progressed to min (guard) with repetition.) Details for Transfer Assistance: Assist to translate trunk anterior and slow descent.  Cues for safest hand placement with pt forgetting to push up/reach back for arm rests.  Performed several trials of sit to/from stand with increased independence by the last trial to min (guard). Ambulation/Gait Ambulation/Gait Assistance: 4: Min assist Ambulation Distance (Feet): 124 Feet Assistive device: 4-wheeled walker Ambulation/Gait Assistance Details: Assist for tall posture and to direct RW around obstacles.  Cues to extend trunk and stay inside RW especially with turns.   Gait Pattern: Decreased stride length;Right flexed knee in stance;Left flexed knee in stance;Trunk flexed;Narrow base of support Stairs: No Wheelchair Mobility Wheelchair Mobility:  No    Exercises     PT Diagnosis:    PT Problem List:   PT Treatment Interventions:     PT Goals Acute Rehab PT Goals PT Goal Formulation: With patient Time For Goal Achievement: 09/03/12 Potential to Achieve Goals: Good PT Goal: Sit to Stand - Progress: Progressing toward goal PT Goal: Stand to Sit - Progress: Progressing toward goal PT Goal: Ambulate - Progress: Progressing toward goal  Visit Information  Last PT Received On: 08/24/12 Assistance Needed: +1    Subjective Data  Subjective: "I am doing just fine." Patient Stated Goal: return home with wife and aides   Cognition  Overall Cognitive Status: Appears within functional limits for tasks assessed/performed Arousal/Alertness: Awake/alert Orientation Level: Oriented X4 / Intact Behavior During Session: [facility] for tasks performed    Balance  Balance Balance Assessed: No  End of Session PT - End of Session Equipment Utilized During Treatment: Gait belt Activity Tolerance: Patient tolerated treatment well Patient left: in chair;with call bell/phone within reach;with family/visitor present Nurse Communication: Mobility status   GP     Cephus Shelling 08/24/2012, 11:16 AM  08/24/2012 Cephus Shelling, PT, DPT [phone]

## 2012-08-27 ENCOUNTER — Ambulatory Visit: Payer: Medicare Other | Admitting: Cardiology

## 2012-08-27 ENCOUNTER — Ambulatory Visit (HOSPITAL_COMMUNITY)
Admission: RE | Admit: 2012-08-27 | Discharge: 2012-08-27 | Disposition: A | Discharge status: Home / Self Care | Payer: BC Managed Care – PPO | Source: Ambulatory Visit | Attending: Cardiology | Admitting: Cardiology

## 2012-08-27 DIAGNOSIS — M6281 Muscle weakness (generalized): Secondary | ICD-10-CM | POA: Insufficient documentation

## 2012-08-27 DIAGNOSIS — IMO0001 Reserved for inherently not codable concepts without codable children: Secondary | ICD-10-CM | POA: Insufficient documentation

## 2012-08-27 DIAGNOSIS — J449 Chronic obstructive pulmonary disease, unspecified: Secondary | ICD-10-CM | POA: Insufficient documentation

## 2012-08-27 DIAGNOSIS — R262 Difficulty in walking, not elsewhere classified: Secondary | ICD-10-CM | POA: Insufficient documentation

## 2012-08-27 DIAGNOSIS — J4489 Other specified chronic obstructive pulmonary disease: Secondary | ICD-10-CM | POA: Insufficient documentation
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
## 2012-08-29 ENCOUNTER — Ambulatory Visit (INDEPENDENT_AMBULATORY_CARE_PROVIDER_SITE_OTHER): Payer: Medicare Other | Admitting: Cardiology

## 2012-08-29 ENCOUNTER — Ambulatory Visit (HOSPITAL_COMMUNITY)
Admission: RE | Admit: 2012-08-29 | Discharge: 2012-08-29 | Disposition: A | Discharge status: Home / Self Care | Payer: BC Managed Care – PPO | Source: Ambulatory Visit | Attending: Internal Medicine | Admitting: Internal Medicine

## 2012-08-29 ENCOUNTER — Encounter: Payer: Self-pay | Admitting: Cardiology

## 2012-08-29 VITALS — BP 109/66 | HR 88 | Ht 73.0 in | Wt 137.0 lb

## 2012-08-29 DIAGNOSIS — E876 Hypokalemia: Secondary | ICD-10-CM

## 2012-08-29 DIAGNOSIS — I10 Essential (primary) hypertension: Secondary | ICD-10-CM

## 2012-08-29 DIAGNOSIS — T148XXA Other injury of unspecified body region, initial encounter: Secondary | ICD-10-CM

## 2012-08-29 DIAGNOSIS — I5022 Chronic systolic (congestive) heart failure: Secondary | ICD-10-CM

## 2012-08-29 DIAGNOSIS — R410 Disorientation, unspecified: Secondary | ICD-10-CM

## 2012-08-29 DIAGNOSIS — I071 Rheumatic tricuspid insufficiency: Secondary | ICD-10-CM | POA: Insufficient documentation

## 2012-08-29 DIAGNOSIS — I2510 Atherosclerotic heart disease of native coronary artery without angina pectoris: Secondary | ICD-10-CM

## 2012-08-29 DIAGNOSIS — I509 Heart failure, unspecified: Secondary | ICD-10-CM

## 2012-08-29 DIAGNOSIS — F29 Unspecified psychosis not due to a substance or known physiological condition: Secondary | ICD-10-CM

## 2012-08-29 DIAGNOSIS — N289 Disorder of kidney and ureter, unspecified: Secondary | ICD-10-CM
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
## 2012-08-29 NOTE — Assessment & Plan Note (Signed)
He had several areas of ecchymosis in the hospital. These are improving.

## 2012-08-29 NOTE — Assessment & Plan Note (Signed)
Renal function will be checked today to be sure that is stable post hospitalization.

## 2012-08-29 NOTE — Patient Instructions (Addendum)
Your physician recommends that you schedule a follow-up appointment in: 3-4 weeks. Your physician recommends that you continue on your current medications as directed. Please refer to the Current Medication list given to you today.  Your physician recommends that you return for lab work today at [facility] for BMET.

## 2012-08-29 NOTE — Assessment & Plan Note (Signed)
At this point I cannot adjust his meds any further. I will await his labs from today. When I see him back we will see if we can make any adjustments.

## 2012-08-29 NOTE — Assessment & Plan Note (Signed)
Coronary disease is stable. Despite his severe illness he did not have a major MI. He did have some enzyme changes compatible with ischemia. Medical therapy is to be continued.
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
## 2012-08-29 NOTE — Assessment & Plan Note (Signed)
The patient's heart failure is stable today. I am hopeful that we can keep it stable. We'll see him for early followup.

## 2012-08-29 NOTE — Assessment & Plan Note (Signed)
Labs will be checked again to be sure his potassium is stable.

## 2012-08-29 NOTE — Assessment & Plan Note (Signed)
Blood pressure is under good control. We had to back off on some of his medicines do to hypotension. He is stable.

## 2012-08-29 NOTE — Assessment & Plan Note (Signed)
His mental status has normalized since hospitalization. No further workup is needed.

## 2012-09-04 ENCOUNTER — Ambulatory Visit (HOSPITAL_COMMUNITY)
Admission: RE | Admit: 2012-09-04 | Discharge: 2012-09-04 | Disposition: A | Discharge status: Home / Self Care | Payer: BC Managed Care – PPO | Source: Ambulatory Visit | Attending: Internal Medicine | Admitting: Internal Medicine

## 2012-09-04 ENCOUNTER — Telehealth: Payer: Self-pay | Admitting: *Deleted

## 2012-09-04 MED ORDER — POTASSIUM CHLORIDE CRYS ER 20 MEQ PO TBCR: 20.0000 meq | EXTENDED_RELEASE_TABLET | Freq: Every day | ORAL | Status: DC

## 2012-09-04 NOTE — Telephone Encounter (Signed)
Message copied by Eustace Moore on Tue Sep 04, 2012 10:13 AM ------      Message from: Willa Rough D      Created: Sat Sep 01, 2012  2:51 PM       Take KCL for three days. Then back to daily. Kidneys look good.
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
## 2012-09-04 NOTE — Telephone Encounter (Signed)
Patient's daughter informed and copy faxed to [phone]. [facility] also notified.

## 2012-09-05 ENCOUNTER — Ambulatory Visit (HOSPITAL_COMMUNITY)
Admission: RE | Admit: 2012-09-05 | Discharge: 2012-09-05 | Disposition: A | Discharge status: Home / Self Care | Payer: BC Managed Care – PPO | Source: Ambulatory Visit | Attending: Internal Medicine | Admitting: Internal Medicine
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
## 2012-09-07 ENCOUNTER — Inpatient Hospital Stay (HOSPITAL_COMMUNITY): Admission: RE | Admit: 2012-09-07 | Payer: BC Managed Care – PPO | Source: Ambulatory Visit

## 2012-09-10 ENCOUNTER — Ambulatory Visit (HOSPITAL_COMMUNITY): Payer: BC Managed Care – PPO | Admitting: Physical Therapy

## 2012-09-11 ENCOUNTER — Ambulatory Visit (HOSPITAL_COMMUNITY)
Admission: RE | Admit: 2012-09-11 | Discharge: 2012-09-11 | Disposition: A | Discharge status: Home / Self Care | Payer: BC Managed Care – PPO | Source: Ambulatory Visit | Attending: Cardiology | Admitting: Cardiology

## 2012-09-11 DIAGNOSIS — R262 Difficulty in walking, not elsewhere classified: Secondary | ICD-10-CM | POA: Insufficient documentation

## 2012-09-11 DIAGNOSIS — M6281 Muscle weakness (generalized): Secondary | ICD-10-CM | POA: Insufficient documentation

## 2012-09-11 DIAGNOSIS — IMO0001 Reserved for inherently not codable concepts without codable children: Secondary | ICD-10-CM | POA: Insufficient documentation

## 2012-09-11 DIAGNOSIS — J449 Chronic obstructive pulmonary disease, unspecified: Secondary | ICD-10-CM | POA: Insufficient documentation

## 2012-09-11 DIAGNOSIS — J4489 Other specified chronic obstructive pulmonary disease: Secondary | ICD-10-CM | POA: Insufficient documentation
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
## 2012-09-12 ENCOUNTER — Ambulatory Visit (HOSPITAL_COMMUNITY)
Admission: RE | Admit: 2012-09-12 | Discharge: 2012-09-12 | Disposition: A | Discharge status: Home / Self Care | Payer: BC Managed Care – PPO | Source: Ambulatory Visit | Attending: Cardiology | Admitting: Cardiology
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
## 2012-09-14 ENCOUNTER — Ambulatory Visit (HOSPITAL_COMMUNITY): Payer: BC Managed Care – PPO | Admitting: *Deleted

## 2012-09-17 ENCOUNTER — Ambulatory Visit (HOSPITAL_COMMUNITY): Payer: BC Managed Care – PPO | Admitting: Physical Therapy

## 2012-09-19 ENCOUNTER — Ambulatory Visit (HOSPITAL_COMMUNITY)
Admission: RE | Admit: 2012-09-19 | Discharge: 2012-09-19 | Disposition: A | Discharge status: Home / Self Care | Payer: BC Managed Care – PPO | Source: Ambulatory Visit | Attending: Cardiology | Admitting: Cardiology
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
## 2012-09-21 ENCOUNTER — Ambulatory Visit (HOSPITAL_COMMUNITY)
Admission: RE | Admit: 2012-09-21 | Discharge: 2012-09-21 | Disposition: A | Discharge status: Home / Self Care | Payer: BC Managed Care – PPO | Source: Ambulatory Visit | Attending: Cardiology | Admitting: Cardiology

## 2012-09-21 ENCOUNTER — Encounter: Payer: Self-pay | Admitting: Cardiology

## 2012-09-21 ENCOUNTER — Ambulatory Visit (INDEPENDENT_AMBULATORY_CARE_PROVIDER_SITE_OTHER): Payer: Medicare Other | Admitting: Cardiology

## 2012-09-21 VITALS — BP 130/64 | HR 80 | Ht 73.0 in | Wt 138.8 lb

## 2012-09-21 DIAGNOSIS — I5022 Chronic systolic (congestive) heart failure: Secondary | ICD-10-CM

## 2012-09-21 DIAGNOSIS — I10 Essential (primary) hypertension: Secondary | ICD-10-CM

## 2012-09-21 DIAGNOSIS — I509 Heart failure, unspecified: Secondary | ICD-10-CM
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
## 2012-09-21 NOTE — Patient Instructions (Addendum)
Your physician recommends that you schedule a follow-up appointment in: 2 months.  Your physician recommends that you continue on your current medications as directed. Please refer to the Current Medication list given to you today.  

## 2012-09-21 NOTE — Assessment & Plan Note (Signed)
His systolic heart failure is under control. When I saw him last labs were checked. BUN was 19 and creatinine 1.06. This is quite good for him. His potassium was a little reduced and he was given a small dose of supplemental potassium. I will not be titrating his meds any further at this time. He looks quite good. I'll see him back in 2 months.

## 2012-09-21 NOTE — Assessment & Plan Note (Signed)
Blood pressures control. No change in therapy. 

## 2012-09-24 ENCOUNTER — Ambulatory Visit (HOSPITAL_COMMUNITY)
Admission: RE | Admit: 2012-09-24 | Discharge: 2012-09-24 | Disposition: A | Discharge status: Home / Self Care | Payer: BC Managed Care – PPO | Source: Ambulatory Visit | Attending: Internal Medicine | Admitting: Internal Medicine
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
## 2012-09-26 ENCOUNTER — Ambulatory Visit (HOSPITAL_COMMUNITY): Payer: Medicare Other

## 2012-09-26 ENCOUNTER — Ambulatory Visit (HOSPITAL_COMMUNITY)
Admission: RE | Admit: 2012-09-26 | Discharge: 2012-09-26 | Disposition: A | Discharge status: Home / Self Care | Payer: BC Managed Care – PPO | Source: Ambulatory Visit | Attending: Cardiology | Admitting: Cardiology
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
## 2012-09-27 ENCOUNTER — Ambulatory Visit (HOSPITAL_COMMUNITY)
Admission: RE | Admit: 2012-09-27 | Discharge: 2012-09-27 | Disposition: A | Discharge status: Home / Self Care | Payer: BC Managed Care – PPO | Source: Ambulatory Visit | Attending: Cardiology | Admitting: Cardiology

## 2012-09-27 ENCOUNTER — Ambulatory Visit (HOSPITAL_COMMUNITY)
Admission: RE | Admit: 2012-09-27 | Discharge: 2012-09-27 | Disposition: A | Discharge status: Home / Self Care | Payer: BC Managed Care – PPO | Source: Ambulatory Visit | Attending: Internal Medicine | Admitting: Internal Medicine
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
## 2012-10-01 ENCOUNTER — Ambulatory Visit (HOSPITAL_COMMUNITY)
Admission: RE | Admit: 2012-10-01 | Discharge: 2012-10-01 | Disposition: A | Discharge status: Home / Self Care | Payer: BC Managed Care – PPO | Source: Ambulatory Visit | Attending: Cardiology | Admitting: Cardiology
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
## 2012-10-03 ENCOUNTER — Ambulatory Visit (HOSPITAL_COMMUNITY)
Admission: RE | Admit: 2012-10-03 | Discharge: 2012-10-03 | Disposition: A | Discharge status: Home / Self Care | Payer: BC Managed Care – PPO | Source: Ambulatory Visit | Attending: Cardiology | Admitting: Cardiology

## 2012-10-03 DIAGNOSIS — J4489 Other specified chronic obstructive pulmonary disease: Secondary | ICD-10-CM | POA: Insufficient documentation

## 2012-10-03 DIAGNOSIS — R262 Difficulty in walking, not elsewhere classified: Secondary | ICD-10-CM | POA: Insufficient documentation

## 2012-10-03 DIAGNOSIS — I10 Essential (primary) hypertension: Secondary | ICD-10-CM | POA: Insufficient documentation

## 2012-10-03 DIAGNOSIS — M25619 Stiffness of unspecified shoulder, not elsewhere classified: Secondary | ICD-10-CM | POA: Insufficient documentation

## 2012-10-03 DIAGNOSIS — M25519 Pain in unspecified shoulder: Secondary | ICD-10-CM | POA: Insufficient documentation

## 2012-10-03 DIAGNOSIS — Z5189 Encounter for other specified aftercare: Secondary | ICD-10-CM | POA: Insufficient documentation

## 2012-10-03 DIAGNOSIS — M6281 Muscle weakness (generalized): Secondary | ICD-10-CM | POA: Insufficient documentation

## 2012-10-03 DIAGNOSIS — J449 Chronic obstructive pulmonary disease, unspecified: Secondary | ICD-10-CM | POA: Insufficient documentation
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
## 2012-10-05 ENCOUNTER — Inpatient Hospital Stay (HOSPITAL_COMMUNITY): Admission: RE | Admit: 2012-10-05 | Payer: Medicare Other | Source: Ambulatory Visit

## 2012-10-08 ENCOUNTER — Ambulatory Visit (HOSPITAL_COMMUNITY): Payer: Medicare Other

## 2012-10-08 ENCOUNTER — Ambulatory Visit (HOSPITAL_COMMUNITY): Payer: Medicare Other | Admitting: Physical Therapy

## 2012-10-10 ENCOUNTER — Ambulatory Visit (HOSPITAL_COMMUNITY): Payer: BC Managed Care – PPO | Admitting: Physical Therapy

## 2012-10-10 ENCOUNTER — Ambulatory Visit (HOSPITAL_COMMUNITY)
Admission: RE | Admit: 2012-10-10 | Discharge: 2012-10-10 | Disposition: A | Discharge status: Home / Self Care | Payer: BC Managed Care – PPO | Source: Ambulatory Visit | Attending: Cardiology | Admitting: Cardiology

## 2012-10-10 DIAGNOSIS — M25519 Pain in unspecified shoulder: Secondary | ICD-10-CM
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
## 2012-10-12 ENCOUNTER — Ambulatory Visit (HOSPITAL_COMMUNITY)
Admission: RE | Admit: 2012-10-12 | Discharge: 2012-10-12 | Disposition: A | Discharge status: Home / Self Care | Payer: BC Managed Care – PPO | Source: Ambulatory Visit | Attending: Cardiology | Admitting: Cardiology

## 2012-10-12 ENCOUNTER — Ambulatory Visit (HOSPITAL_COMMUNITY): Payer: BC Managed Care – PPO | Admitting: Physical Therapy

## 2012-10-12 DIAGNOSIS — J4489 Other specified chronic obstructive pulmonary disease: Secondary | ICD-10-CM | POA: Insufficient documentation

## 2012-10-12 DIAGNOSIS — J449 Chronic obstructive pulmonary disease, unspecified: Secondary | ICD-10-CM | POA: Insufficient documentation

## 2012-10-12 DIAGNOSIS — M6281 Muscle weakness (generalized): Secondary | ICD-10-CM | POA: Insufficient documentation

## 2012-10-12 DIAGNOSIS — IMO0001 Reserved for inherently not codable concepts without codable children: Secondary | ICD-10-CM | POA: Insufficient documentation

## 2012-10-12 DIAGNOSIS — R262 Difficulty in walking, not elsewhere classified: Secondary | ICD-10-CM | POA: Insufficient documentation

## 2012-10-12 DIAGNOSIS — M25519 Pain in unspecified shoulder: Secondary | ICD-10-CM
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
## 2012-10-15 ENCOUNTER — Ambulatory Visit (HOSPITAL_COMMUNITY)
Admission: RE | Admit: 2012-10-15 | Discharge: 2012-10-15 | Disposition: A | Discharge status: Home / Self Care | Payer: BC Managed Care – PPO | Source: Ambulatory Visit | Attending: Internal Medicine | Admitting: Internal Medicine

## 2012-10-15 DIAGNOSIS — M25519 Pain in unspecified shoulder: Secondary | ICD-10-CM
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
## 2012-10-17 ENCOUNTER — Ambulatory Visit (HOSPITAL_COMMUNITY)
Admission: RE | Admit: 2012-10-17 | Discharge: 2012-10-17 | Disposition: A | Discharge status: Home / Self Care | Payer: BC Managed Care – PPO | Source: Ambulatory Visit | Attending: Cardiology | Admitting: Cardiology

## 2012-10-17 DIAGNOSIS — M25519 Pain in unspecified shoulder: Secondary | ICD-10-CM
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
## 2012-10-19 ENCOUNTER — Ambulatory Visit (HOSPITAL_COMMUNITY)
Admission: RE | Admit: 2012-10-19 | Discharge: 2012-10-19 | Disposition: A | Discharge status: Home / Self Care | Payer: BC Managed Care – PPO | Source: Ambulatory Visit | Attending: Cardiology | Admitting: Cardiology

## 2012-10-19 DIAGNOSIS — M25519 Pain in unspecified shoulder: Secondary | ICD-10-CM
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
## 2012-10-22 ENCOUNTER — Ambulatory Visit (HOSPITAL_COMMUNITY)
Admission: RE | Admit: 2012-10-22 | Discharge: 2012-10-22 | Disposition: A | Discharge status: Home / Self Care | Payer: BC Managed Care – PPO | Source: Ambulatory Visit | Attending: Specialist | Admitting: Specialist

## 2012-10-22 DIAGNOSIS — M25519 Pain in unspecified shoulder: Secondary | ICD-10-CM
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
## 2012-10-24 ENCOUNTER — Ambulatory Visit (HOSPITAL_COMMUNITY): Payer: BC Managed Care – PPO | Admitting: Physical Therapy

## 2012-10-24 ENCOUNTER — Ambulatory Visit (HOSPITAL_COMMUNITY)
Admission: RE | Admit: 2012-10-24 | Discharge: 2012-10-24 | Disposition: A | Discharge status: Home / Self Care | Payer: BC Managed Care – PPO | Source: Ambulatory Visit | Attending: Internal Medicine | Admitting: Internal Medicine

## 2012-10-24 DIAGNOSIS — M25519 Pain in unspecified shoulder: Secondary | ICD-10-CM
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
## 2012-10-26 ENCOUNTER — Ambulatory Visit (HOSPITAL_COMMUNITY): Payer: BC Managed Care – PPO | Admitting: Occupational Therapy

## 2012-10-29 ENCOUNTER — Ambulatory Visit (HOSPITAL_COMMUNITY): Payer: BC Managed Care – PPO | Admitting: Physical Therapy

## 2012-10-29 ENCOUNTER — Ambulatory Visit (HOSPITAL_COMMUNITY)
Admission: RE | Admit: 2012-10-29 | Discharge: 2012-10-29 | Disposition: A | Discharge status: Home / Self Care | Payer: BC Managed Care – PPO | Source: Ambulatory Visit | Attending: Internal Medicine | Admitting: Internal Medicine

## 2012-10-29 DIAGNOSIS — M25519 Pain in unspecified shoulder: Secondary | ICD-10-CM
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
## 2012-10-31 ENCOUNTER — Ambulatory Visit (HOSPITAL_COMMUNITY): Payer: BC Managed Care – PPO

## 2012-10-31 ENCOUNTER — Ambulatory Visit (HOSPITAL_COMMUNITY): Payer: BC Managed Care – PPO | Admitting: Occupational Therapy

## 2012-11-02 ENCOUNTER — Ambulatory Visit (HOSPITAL_COMMUNITY): Payer: BC Managed Care – PPO | Admitting: Physical Therapy

## 2012-11-14 ENCOUNTER — Other Ambulatory Visit (HOSPITAL_COMMUNITY): Payer: Self-pay | Admitting: Physician Assistant

## 2012-11-19 ENCOUNTER — Ambulatory Visit: Payer: Medicare Other | Admitting: Cardiology

## 2013-01-04 ENCOUNTER — Ambulatory Visit (INDEPENDENT_AMBULATORY_CARE_PROVIDER_SITE_OTHER): Payer: Medicare Other | Admitting: Cardiology

## 2013-01-04 ENCOUNTER — Encounter: Payer: Self-pay | Admitting: Cardiology

## 2013-01-04 VITALS — BP 95/56 | HR 66 | Ht 73.0 in | Wt 148.0 lb

## 2013-01-04 DIAGNOSIS — I10 Essential (primary) hypertension: Secondary | ICD-10-CM

## 2013-01-04 DIAGNOSIS — I509 Heart failure, unspecified: Secondary | ICD-10-CM

## 2013-01-04 DIAGNOSIS — I2510 Atherosclerotic heart disease of native coronary artery without angina pectoris: Secondary | ICD-10-CM

## 2013-01-04 DIAGNOSIS — I5022 Chronic systolic (congestive) heart failure: Secondary | ICD-10-CM

## 2013-01-04 NOTE — Patient Instructions (Addendum)
Your physician recommends that you schedule a follow-up appointment in: 4 months. Your physician recommends that you continue on your current medications as directed. Please refer to the Current Medication list given to you today. Your physician recommends that you return for lab work today at [facility] for BMET.

## 2013-01-04 NOTE — Assessment & Plan Note (Signed)
Coronary disease is stable. No change in therapy. 
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
## 2013-01-04 NOTE — Assessment & Plan Note (Signed)
Volume status is stable. No change in therapy. We will check his chemistry.

## 2013-01-04 NOTE — Assessment & Plan Note (Signed)
Blood pressure is relatively low today. However he has no symptoms and he is stable. No change in therapy.

## 2013-01-07 ENCOUNTER — Encounter: Payer: Self-pay | Admitting: Cardiology

## 2013-01-07 NOTE — Progress Notes (Unsigned)
   I learned today that the patient's potassium was 6.8 on January 24. He is being contacted immediately to stop his potassium and his spironolactone. He was clinically stable when I saw him on that day. He will have a followup potassium on January 09, 2013. The office will call the results to me.

## 2013-01-10 ENCOUNTER — Encounter: Payer: Self-pay | Admitting: Cardiology

## 2013-01-10 ENCOUNTER — Other Ambulatory Visit: Payer: Self-pay | Admitting: *Deleted

## 2013-01-10 DIAGNOSIS — E875 Hyperkalemia: Secondary | ICD-10-CM

## 2013-01-10 DIAGNOSIS — Z79899 Other long term (current) drug therapy: Secondary | ICD-10-CM

## 2013-01-11 ENCOUNTER — Other Ambulatory Visit: Payer: Self-pay | Admitting: *Deleted

## 2013-01-11 DIAGNOSIS — E875 Hyperkalemia: Secondary | ICD-10-CM

## 2013-01-17 ENCOUNTER — Encounter: Payer: Self-pay | Admitting: *Deleted

## 2013-01-17 NOTE — Progress Notes (Signed)
Patient ID: Jonathan Craig, male   DOB: 1924/07/07, 79 y.o.   MRN: [id number]   Labs dated 01/10/2013 - potassium 6.1   Per Dr. Myrtis Ser - Daugher [name] Wiemers) notified to remain off the Potassium and Spironolactone.                          Repeat BMET on 01/15/2013.

## 2013-01-18 ENCOUNTER — Telehealth: Payer: Self-pay | Admitting: *Deleted

## 2013-01-18 DIAGNOSIS — I509 Heart failure, unspecified: Secondary | ICD-10-CM

## 2013-01-18 NOTE — Telephone Encounter (Signed)
Patient informed. Lab order faxed to [facility] lab. 

## 2013-01-18 NOTE — Telephone Encounter (Signed)
Message copied by Eustace Moore on Fri Jan 18, 2013  4:20 PM ------      Message from: Lesle Chris      Created: Fri Jan 18, 2013  1:37 PM                   ----- Message -----         From: Luis Abed, MD         Sent: 01/18/2013  10:24 AM           To: Lesle Chris, LPN            Potassium continues to improve with him off potassium supplement and off spironolactone. Continue same therapy. Followup lab in 4 weeks

## 2013-03-12 ENCOUNTER — Other Ambulatory Visit: Payer: Self-pay | Admitting: *Deleted

## 2013-03-12 MED ORDER — CARVEDILOL 3.125 MG PO TABS: 3.1250 mg | ORAL_TABLET | Freq: Two times a day (BID) | ORAL | Status: DC

## 2013-05-03 ENCOUNTER — Encounter: Payer: Self-pay | Admitting: Cardiology

## 2013-05-03 ENCOUNTER — Ambulatory Visit (INDEPENDENT_AMBULATORY_CARE_PROVIDER_SITE_OTHER): Payer: 59 | Admitting: Cardiology

## 2013-05-03 VITALS — BP 120/56 | HR 71 | Ht 73.0 in | Wt 143.5 lb

## 2013-05-03 DIAGNOSIS — I509 Heart failure, unspecified: Secondary | ICD-10-CM

## 2013-05-03 DIAGNOSIS — I2510 Atherosclerotic heart disease of native coronary artery without angina pectoris: Secondary | ICD-10-CM

## 2013-05-03 DIAGNOSIS — T148XXA Other injury of unspecified body region, initial encounter: Secondary | ICD-10-CM

## 2013-05-03 DIAGNOSIS — E875 Hyperkalemia: Secondary | ICD-10-CM

## 2013-05-03 DIAGNOSIS — N182 Chronic kidney disease, stage 2 (mild): Secondary | ICD-10-CM

## 2013-05-03 DIAGNOSIS — I5022 Chronic systolic (congestive) heart failure: Secondary | ICD-10-CM

## 2013-05-03 NOTE — Assessment & Plan Note (Signed)
Coronary disease is stable.  No further workup. 

## 2013-05-03 NOTE — Patient Instructions (Addendum)
Have left message with daughter [name]) on Potassium and Spironolactone Lab for BMET today Office will contact with results Follow up in  3 months
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
## 2013-05-03 NOTE — Assessment & Plan Note (Signed)
His volume status is quite stable. We will check his labs today.

## 2013-05-03 NOTE — Assessment & Plan Note (Signed)
Labs will be repeated today

## 2013-05-03 NOTE — Assessment & Plan Note (Signed)
He still has a few areas of ecchymoses. These are quite small. No further workup.

## 2013-05-03 NOTE — Assessment & Plan Note (Signed)
His potassium normalized when his potassium and spironolactone were stopped. He is not supposed to be taking either of these at this time. We are re\re verifying this with his family and labs will be checked today.

## 2013-05-07 ENCOUNTER — Other Ambulatory Visit: Payer: Self-pay | Admitting: *Deleted

## 2013-05-14 ENCOUNTER — Telehealth: Payer: Self-pay | Admitting: *Deleted

## 2013-05-14 NOTE — Telephone Encounter (Signed)
Patient informed. 

## 2013-05-14 NOTE — Telephone Encounter (Signed)
Message copied by Eustace Moore on Tue May 14, 2013  9:59 AM ------      Message from: Myrtis Ser, [place] D      Created: Thu May 09, 2013  2:43 PM       This lab is good for him ------

## 2013-06-13 ENCOUNTER — Other Ambulatory Visit (HOSPITAL_COMMUNITY): Payer: Self-pay | Admitting: Physician Assistant

## 2013-08-13 ENCOUNTER — Ambulatory Visit: Payer: BC Managed Care – PPO | Admitting: Cardiology

## 2013-09-03 ENCOUNTER — Encounter: Payer: Self-pay | Admitting: Cardiology

## 2013-09-03 ENCOUNTER — Ambulatory Visit (INDEPENDENT_AMBULATORY_CARE_PROVIDER_SITE_OTHER): Payer: Medicare Other | Admitting: Cardiology

## 2013-09-03 VITALS — BP 158/66 | HR 55 | Ht 73.0 in | Wt 138.1 lb

## 2013-09-03 DIAGNOSIS — R0602 Shortness of breath: Secondary | ICD-10-CM

## 2013-09-03 DIAGNOSIS — I498 Other specified cardiac arrhythmias: Secondary | ICD-10-CM

## 2013-09-03 DIAGNOSIS — I5022 Chronic systolic (congestive) heart failure: Secondary | ICD-10-CM

## 2013-09-03 DIAGNOSIS — R001 Bradycardia, unspecified: Secondary | ICD-10-CM

## 2013-09-03 DIAGNOSIS — I509 Heart failure, unspecified: Secondary | ICD-10-CM

## 2013-09-03 DIAGNOSIS — I2510 Atherosclerotic heart disease of native coronary artery without angina pectoris: Secondary | ICD-10-CM

## 2013-09-03 NOTE — Assessment & Plan Note (Signed)
His volume status is stable. No change in therapy. 

## 2013-09-03 NOTE — Assessment & Plan Note (Signed)
Patient has asymptomatic sinus bradycardia. He is on a very low dose of carvedilol. It is important to continue this for his left ventricular dysfunction. His blood pressures not low it is not having symptoms.
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
## 2013-09-03 NOTE — Assessment & Plan Note (Signed)
Patient has nonischemic cardiomyopathy. I'm not able to adjust his meds any further because of his bradycardia.

## 2013-09-03 NOTE — Patient Instructions (Addendum)

## 2013-09-03 NOTE — Assessment & Plan Note (Signed)
He's not having any significant shortness of breath. No change in therapy.

## 2013-09-09 ENCOUNTER — Other Ambulatory Visit: Payer: Self-pay | Admitting: Cardiology

## 2013-09-12 ENCOUNTER — Ambulatory Visit: Payer: BC Managed Care – PPO | Admitting: Cardiology

## 2014-01-06 ENCOUNTER — Other Ambulatory Visit: Payer: Self-pay | Admitting: Cardiology

## 2014-02-04 IMAGING — CT CT HEAD W/O CM
3 of 4 series · 17 of 30 positions shown, 19 images · non-contrast
Comparison: None.

CLINICAL DATA: Altered mental status

CT HEAD WITHOUT CONTRAST
TECHNIQUE: Contiguous axial images were obtained from the base of
the skull through the vertex without contrast.

[Series 1: — · axial · 0.49mm/px · z∈[-567,-417]mm · 5 of 46 slices shown (1 of 3)]
[im 8/46  brain]
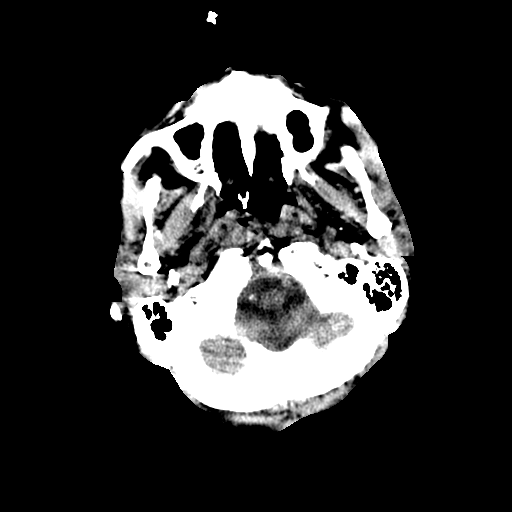
[im 16/46  brain]
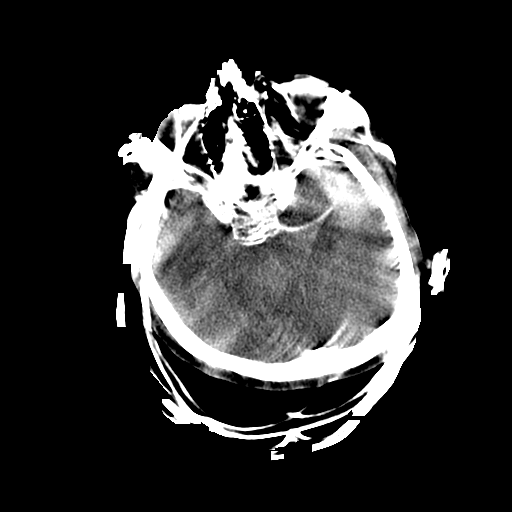
[im 23/46  brain]
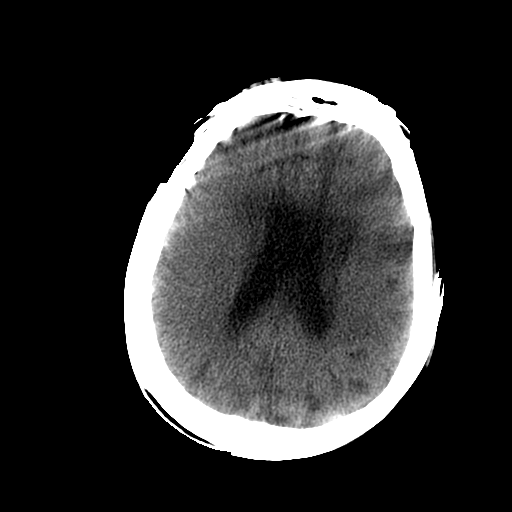
[im 31/46  brain]
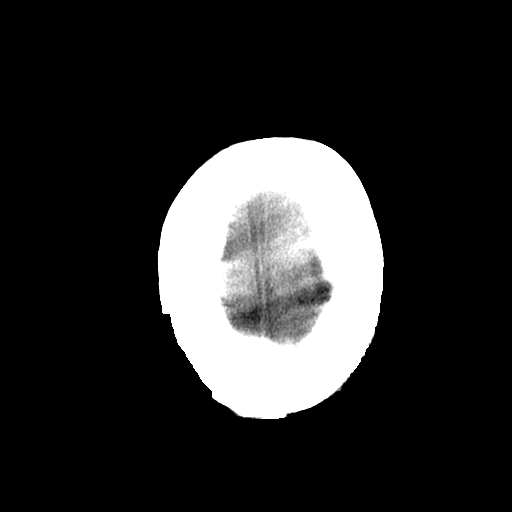
[im 38/46  brain]
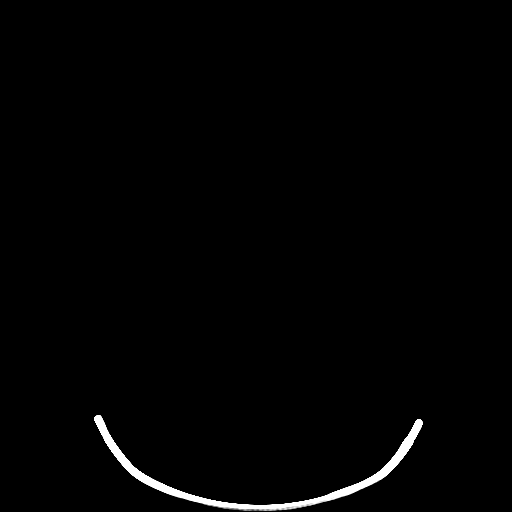

[Series 4: — · axial · 0.49mm/px · z∈[-581,-421]mm · 6 of 46 slices shown, 8 images (2 of 3)]
[im 7/46  brain]
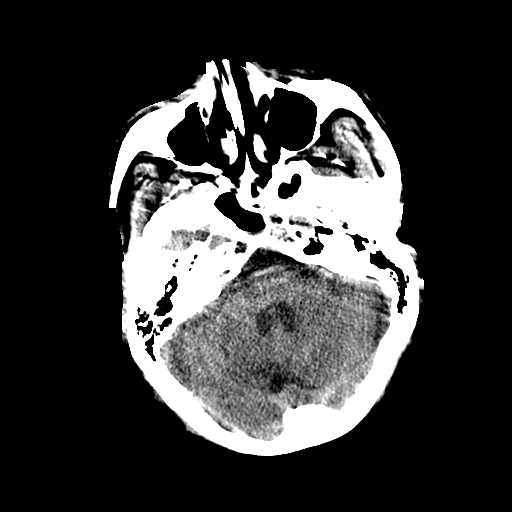
[im 7/46  bone]
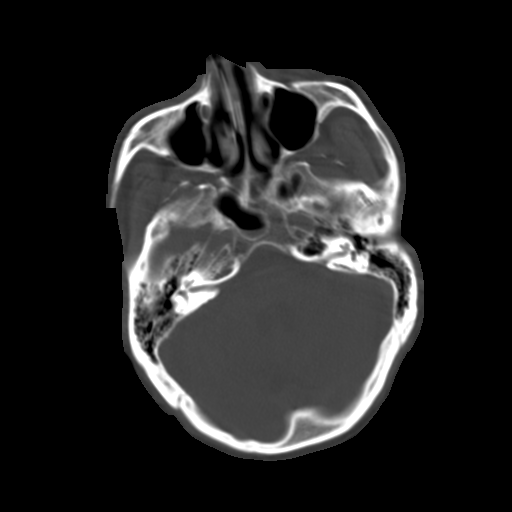
[im 13/46  brain]
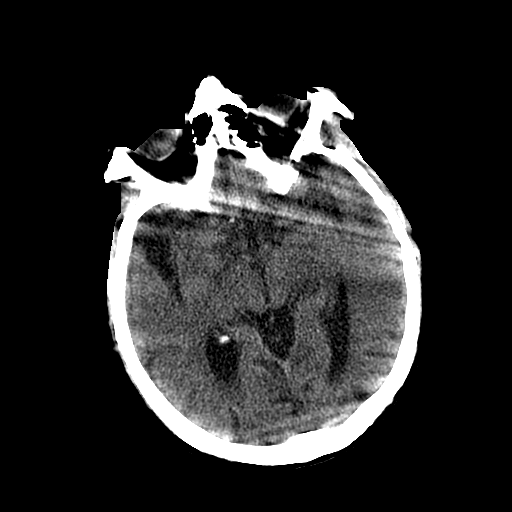
[im 20/46  brain]
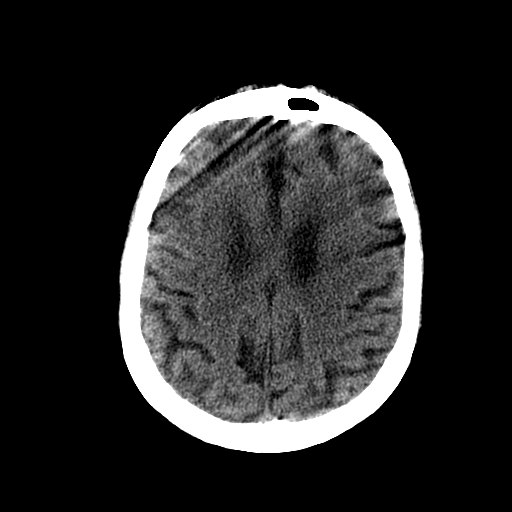
[im 26/46  brain]
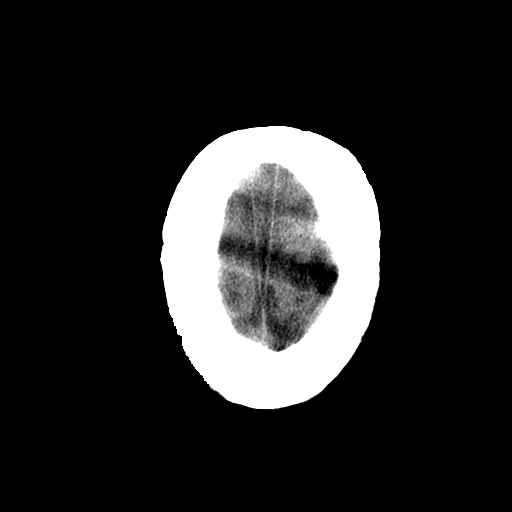
[im 33/46  brain]
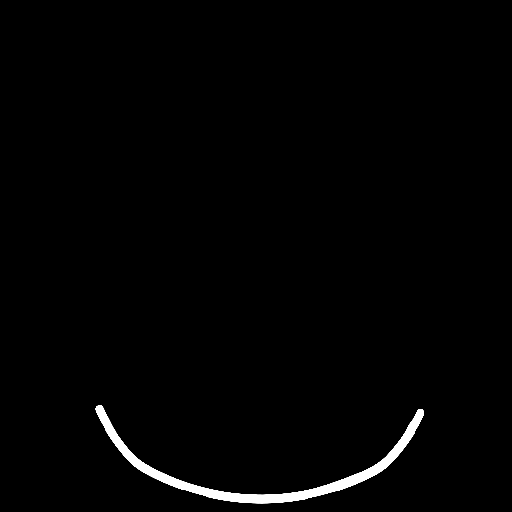
[im 33/46  bone]
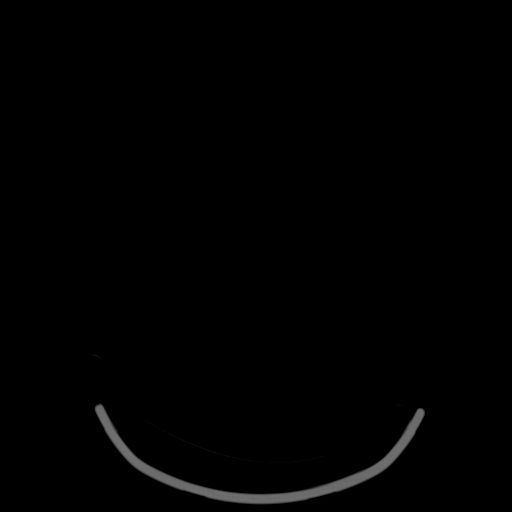
[im 39/46  brain]
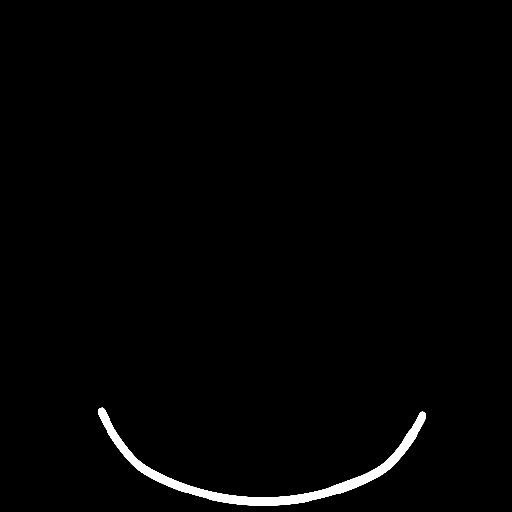

[Series 5: — · axial · 0.49mm/px · z∈[-581,-421]mm · 6 of 46 slices shown (3 of 3)]
[im 7/46  brain]
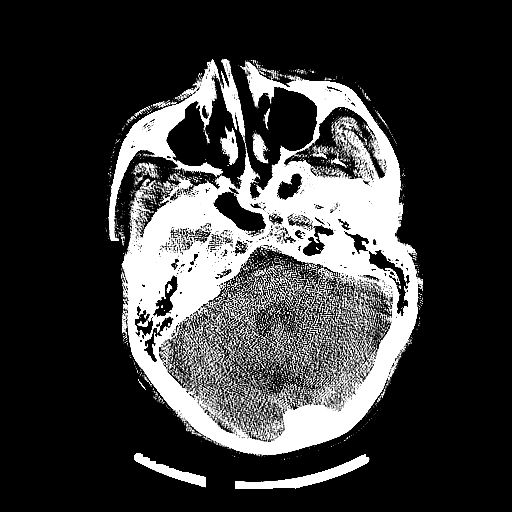
[im 13/46  brain]
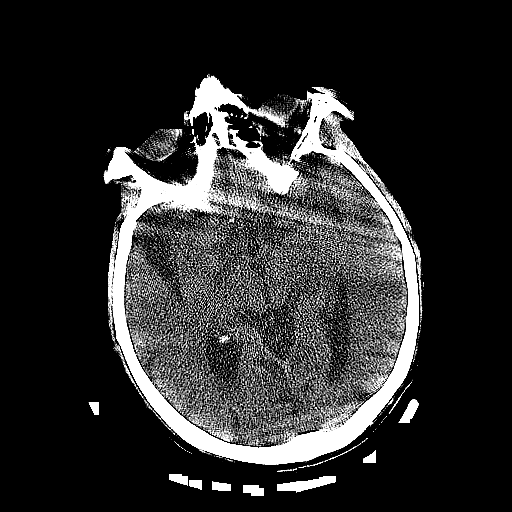
[im 20/46  brain]
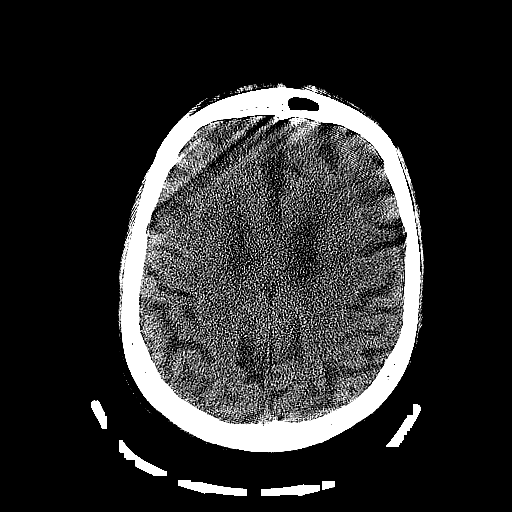
[im 26/46  brain]
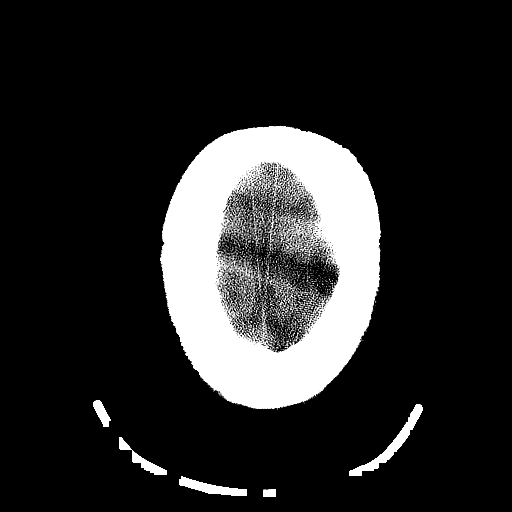
[im 33/46  brain]
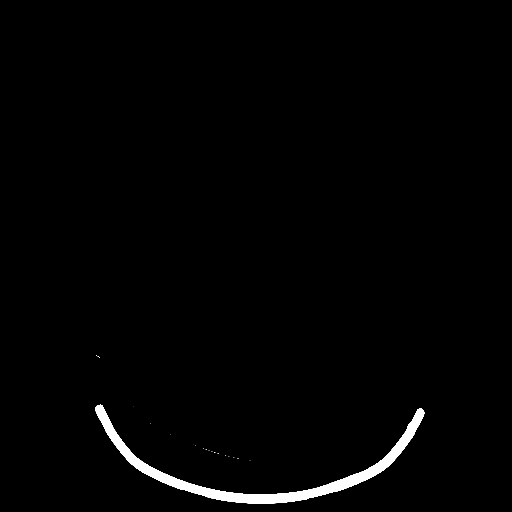
[im 39/46  brain]
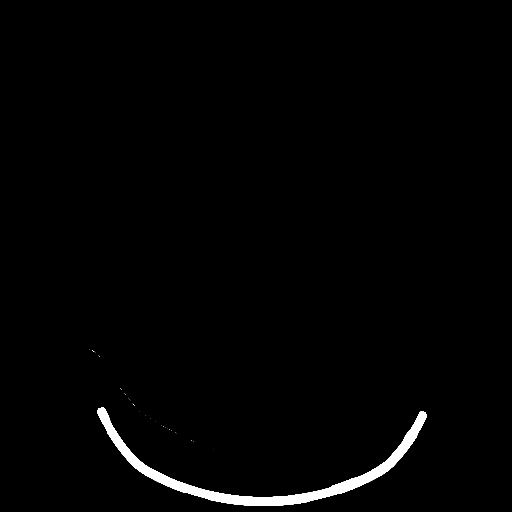

[17 of 30 positions shown; findings below may reference images not displayed]

FINDINGS: Portable CT equipment was utilized.

The patient was agitated and did not hold still.  The first attempt
is nondiagnostic.

A second   attempt was made following IV Versed.  There is
extensive motion artifact on the second attempt.  There is limited
information available.  There is generalized atrophy.  No
hemorrhage or large mass is seen.  Acute infarct cannot be
evaluated on this study.
IMPRESSION: The patient could not hold still.  The study has very limited
diagnostic information.  No acute hemorrhage is identified.

## 2014-02-04 IMAGING — CR DG CHEST 1V PORT
1 series · 1 of 1 positions shown · non-contrast
Comparison: 08/16/2012

CLINICAL DATA: Central line placement.  Ventilator dependent
respiratory failure.

PORTABLE CHEST - 1 VIEW

[view not recorded]
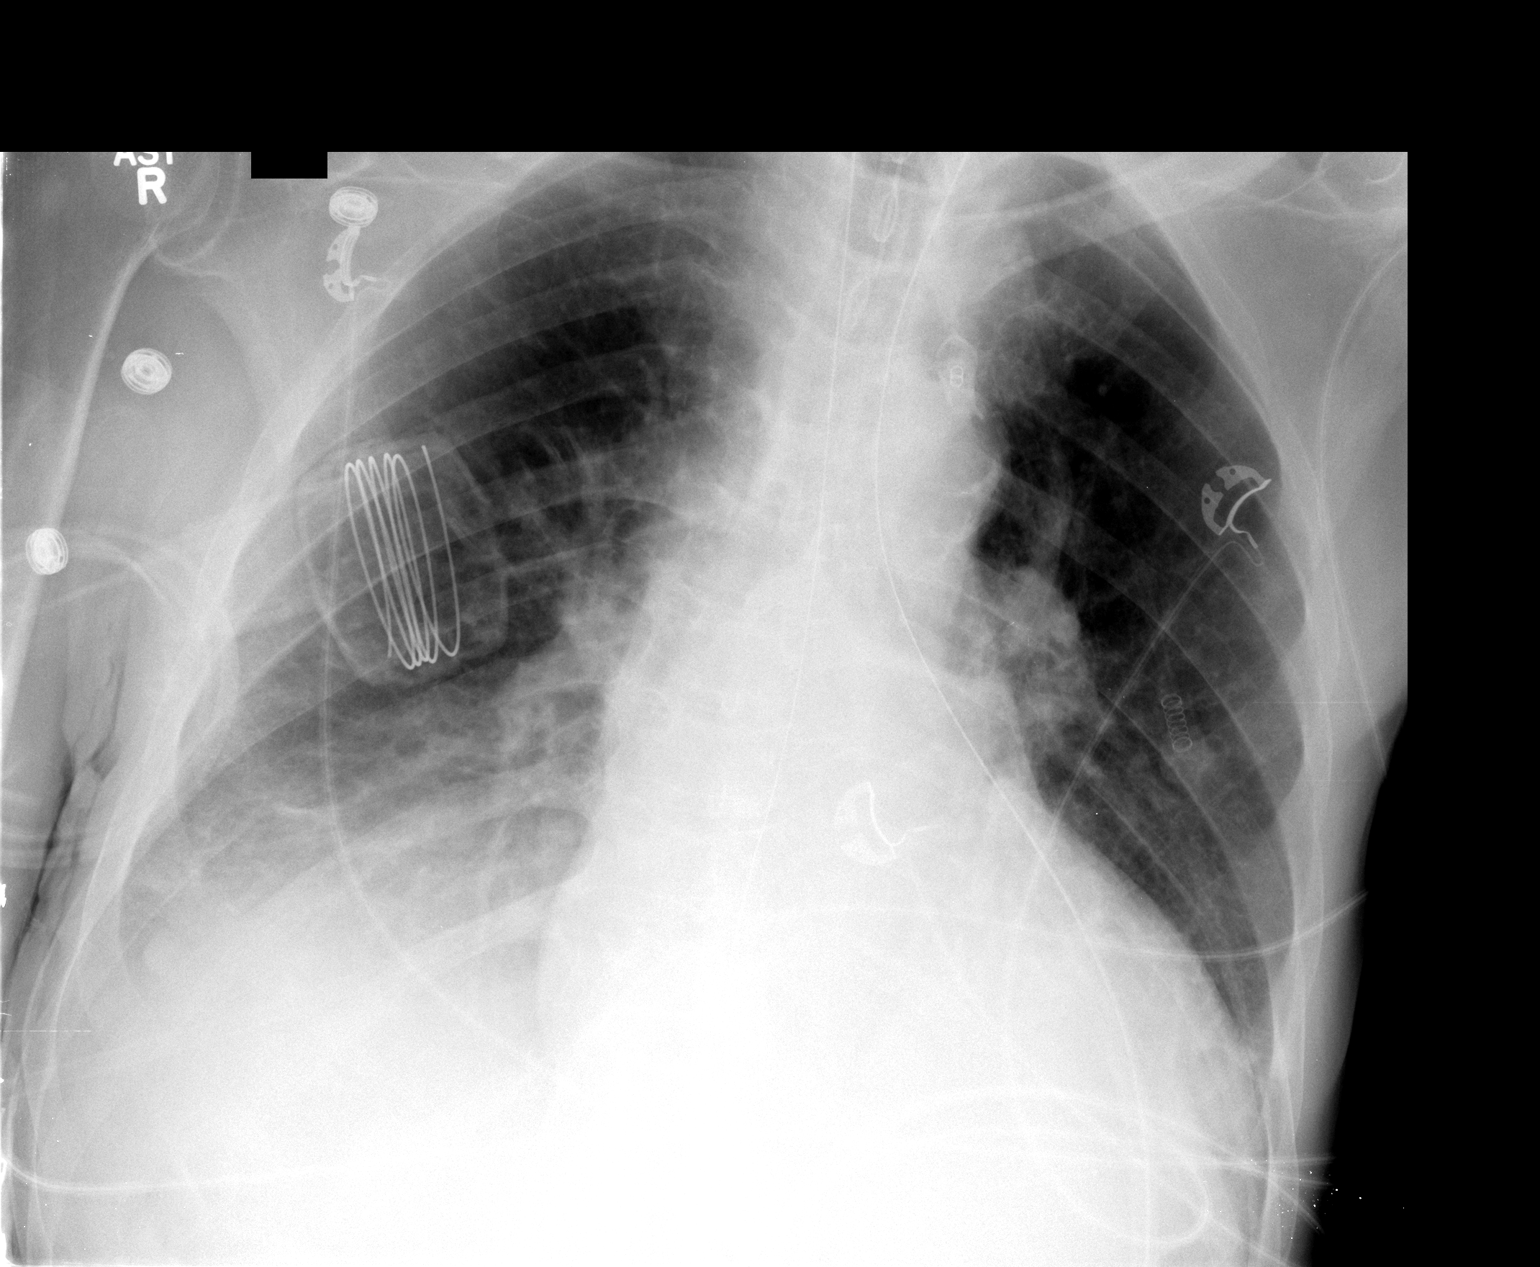

[1 of 1 positions shown; findings below may reference images not displayed]

FINDINGS: Endotracheal tube is seen with tip at the level of
clavicles.  Nasogastric tube is seen entering the stomach.  No
other central line identified, however there is no evidence of
pneumothorax.

Cardiomegaly stable. Bibasilar atelectasis versus infiltrates again
noted, and also without significant change.
IMPRESSION: Stable cardiomegaly and bibasilar atelectasis versus infiltrates.

## 2014-02-06 ENCOUNTER — Other Ambulatory Visit: Payer: Self-pay | Admitting: Cardiology

## 2014-02-07 IMAGING — CR DG CHEST 1V PORT
1 series · 1 of 1 positions shown · non-contrast
Comparison: 08/17/2012 and earlier.

CLINICAL DATA: 87-year-old male acute respiratory failure.

PORTABLE CHEST - 1 VIEW

[AP]
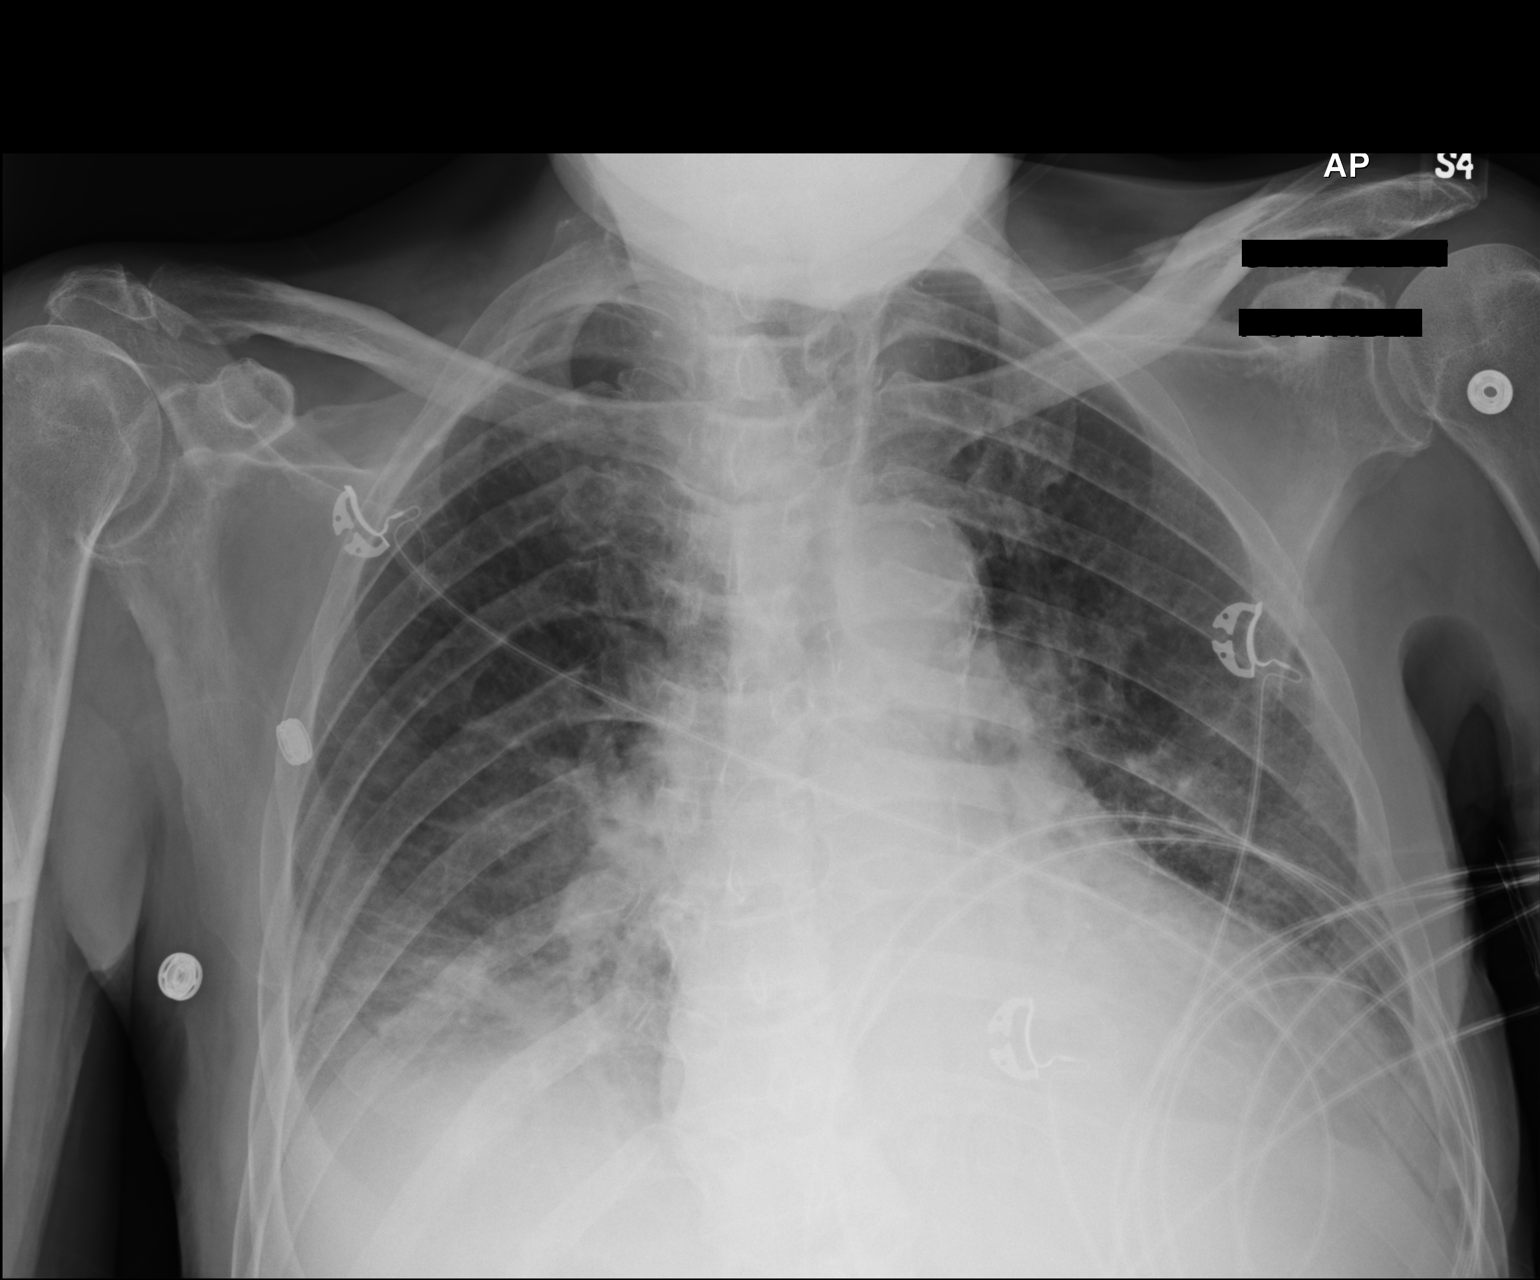

[1 of 1 positions shown; findings below may reference images not displayed]

FINDINGS: AP portable semi upright AP view [id number] hours.  Extubated.
Enteric tube removed.  No central line identified.

Stable lung volumes.  Stable cardiac size and mediastinal contours.
Continued patchy opacity at both lung bases.  No pneumothorax,
edema or large effusion.
IMPRESSION: 1.  Extubated and enteric tube removed.
2.  Stable lung volumes and basilar hypoventilation.

## 2014-03-06 ENCOUNTER — Other Ambulatory Visit: Payer: Self-pay | Admitting: Cardiology

## 2014-05-01 ENCOUNTER — Telehealth: Payer: Self-pay | Admitting: Cardiology

## 2014-05-01 NOTE — Telephone Encounter (Signed)
Telephone call directed to me from Dr. Virgina Organ regarding Jonathan Craig, a patient of Dr. Myrtis Ser. I reviewed the patient's chart and spoke with Dr. Virgina Organ in the absence of Dr. Myrtis Ser. Dr. Virgina Organ explained that he saw Jonathan Craig for a regular visit, finding him to be clinically stable with no active cardiac symptoms, although recorded blood pressure was 100/50. We reviewed his cardiac medications, and I also reviewed the patient's vital signs over time. Systolic blood pressures have fluctuated and have been around 100 before, sometimes even lower, although the patient has been asymptomatic, and this did not precipitate any major changes in his cardiac regimen. I recommended that no changes be made now, continue to keep up with blood pressure, and follow with Dr. Myrtis Ser as already scheduled.

## 2014-05-08 ENCOUNTER — Ambulatory Visit (INDEPENDENT_AMBULATORY_CARE_PROVIDER_SITE_OTHER): Payer: Medicare Other | Admitting: Cardiology

## 2014-05-08 ENCOUNTER — Encounter: Payer: Self-pay | Admitting: Cardiology

## 2014-05-08 VITALS — BP 110/61 | HR 75 | Ht 73.0 in | Wt 128.8 lb

## 2014-05-08 DIAGNOSIS — I2510 Atherosclerotic heart disease of native coronary artery without angina pectoris: Secondary | ICD-10-CM

## 2014-05-08 DIAGNOSIS — I509 Heart failure, unspecified: Secondary | ICD-10-CM

## 2014-05-08 DIAGNOSIS — I10 Essential (primary) hypertension: Secondary | ICD-10-CM

## 2014-05-08 DIAGNOSIS — I5022 Chronic systolic (congestive) heart failure: Secondary | ICD-10-CM

## 2014-05-08 NOTE — Assessment & Plan Note (Addendum)
Blood pressure is controlled. No change in therapy. Recently Dr. Virgina Organ had called our office to let us know that he was concerned about a pressure of 100/50. Pressure today is 110/60. He is not having any significant symptoms. Chemistry is going to be checked to be sure that he is not to drive from his diuretic. Otherwise we will not be making any changes today.
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
## 2014-05-08 NOTE — Assessment & Plan Note (Signed)
Coronary disease is stable. No change in therapy. 

## 2014-05-08 NOTE — Assessment & Plan Note (Signed)
He is on appropriate medications for his cardiomyopathy. No change in therapy.

## 2014-05-08 NOTE — Patient Instructions (Signed)
Your physician recommends that you schedule a follow-up appointment in: 6 months. You will receive a reminder letter in the mail in about 4 months reminding you to call and schedule your appointment. If you don't receive this letter, please contact our office. Your physician recommends that you continue on your current medications as directed. Please refer to the Current Medication list given to you today. Your physician recommends that you have lab work today to check your BMET. Please increase your caloric intake. You can get ensure drinks to help with this.

## 2014-05-08 NOTE — Addendum Note (Signed)
Addended by: Eustace Moore on: 05/08/2014 11:42 AM   Modules accepted: Orders

## 2014-05-08 NOTE — Assessment & Plan Note (Signed)
His volume status is stable. However his weight is down. This is probably from decreased calories. However renal function will be checked today to be sure that he has not become too dry.

## 2014-05-15 ENCOUNTER — Telehealth: Payer: Self-pay | Admitting: *Deleted

## 2014-05-15 NOTE — Telephone Encounter (Signed)
Message copied by Eustace Moore on Thu May 15, 2014 11:31 AM ------      Message from: Willa Rough D      Created: Sun May 11, 2014  1:49 PM       Please let him know that his lab looks good ------

## 2014-05-15 NOTE — Telephone Encounter (Signed)
Patient informed. 

## 2014-09-04 ENCOUNTER — Other Ambulatory Visit: Payer: Self-pay | Admitting: Cardiology

## 2014-09-04 ENCOUNTER — Other Ambulatory Visit: Payer: Self-pay | Admitting: *Deleted

## 2014-09-04 MED ORDER — CARVEDILOL 3.125 MG PO TABS: ORAL_TABLET | ORAL | Status: DC

## 2015-03-27 ENCOUNTER — Other Ambulatory Visit: Payer: Self-pay | Admitting: Cardiology

## 2015-04-29 ENCOUNTER — Ambulatory Visit (INDEPENDENT_AMBULATORY_CARE_PROVIDER_SITE_OTHER): Payer: Medicare Other | Admitting: Cardiology

## 2015-04-29 ENCOUNTER — Encounter: Payer: Self-pay | Admitting: Cardiology

## 2015-04-29 VITALS — BP 85/36 | HR 63 | Ht 73.0 in | Wt 125.8 lb

## 2015-04-29 DIAGNOSIS — I2510 Atherosclerotic heart disease of native coronary artery without angina pectoris: Secondary | ICD-10-CM | POA: Diagnosis not present

## 2015-04-29 DIAGNOSIS — I4510 Unspecified right bundle-branch block: Secondary | ICD-10-CM | POA: Diagnosis not present

## 2015-04-29 DIAGNOSIS — I5022 Chronic systolic (congestive) heart failure: Secondary | ICD-10-CM | POA: Diagnosis not present

## 2015-04-29 DIAGNOSIS — R9431 Abnormal electrocardiogram [ECG] [EKG]: Secondary | ICD-10-CM

## 2015-04-29 DIAGNOSIS — I4581 Long QT syndrome: Secondary | ICD-10-CM

## 2015-04-29 MED ORDER — POTASSIUM CHLORIDE CRYS ER 20 MEQ PO TBCR: 20.0000 meq | EXTENDED_RELEASE_TABLET | Freq: Every day | ORAL | Status: DC

## 2015-04-29 MED ORDER — FUROSEMIDE 40 MG PO TABS: 40.0000 mg | ORAL_TABLET | Freq: Every day | ORAL | Status: DC

## 2015-04-29 NOTE — Assessment & Plan Note (Signed)
Right bundle branch block is old. No change in therapy. 

## 2015-04-29 NOTE — Patient Instructions (Signed)
Your physician has recommended you make the following change in your medication:  Decrease your furosemide 40 mg to daily. Decrease your potassium chloride 20 meq to daily. Continue all other medications the same. Your physician recommends that you schedule a follow-up appointment in: August 2016 with Dr. Myrtis Ser.
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
## 2015-04-29 NOTE — Assessment & Plan Note (Signed)
Coronary disease is stable. No change in therapy. 

## 2015-04-29 NOTE — Assessment & Plan Note (Signed)
The patient's fall used status is stable. He is on Lasix twice a day. His blood pressure is low. He is not having any significant symptoms. However I have still decided to reduce his medications. Lasix will be reduced to once daily. Low-dose indoor will be stopped. He has early follow-up with his primary physician. If he has increased volume or change in symptoms, his medications could be readjusted back upwards.

## 2015-06-08 ENCOUNTER — Other Ambulatory Visit: Payer: Self-pay | Admitting: Cardiology

## 2015-07-29 ENCOUNTER — Ambulatory Visit: Payer: Medicare Other | Admitting: Cardiology

## 2015-08-26 ENCOUNTER — Encounter: Payer: Self-pay | Admitting: Cardiology

## 2015-08-26 ENCOUNTER — Ambulatory Visit (INDEPENDENT_AMBULATORY_CARE_PROVIDER_SITE_OTHER): Payer: Medicare Other | Admitting: Cardiology

## 2015-08-26 VITALS — BP 139/65 | HR 81 | Ht 73.0 in | Wt 123.4 lb

## 2015-08-26 DIAGNOSIS — I2510 Atherosclerotic heart disease of native coronary artery without angina pectoris: Secondary | ICD-10-CM

## 2015-08-26 DIAGNOSIS — Z01810 Encounter for preprocedural cardiovascular examination: Secondary | ICD-10-CM

## 2015-08-26 DIAGNOSIS — I5022 Chronic systolic (congestive) heart failure: Secondary | ICD-10-CM | POA: Diagnosis not present

## 2015-08-26 DIAGNOSIS — R0602 Shortness of breath: Secondary | ICD-10-CM

## 2015-08-26 DIAGNOSIS — I429 Cardiomyopathy, unspecified: Secondary | ICD-10-CM

## 2015-08-26 DIAGNOSIS — I428 Other cardiomyopathies: Secondary | ICD-10-CM
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
## 2015-08-26 NOTE — Patient Instructions (Signed)
Your physician recommends that you continue on your current medications as directed. Please refer to the Current Medication list given to you today. Your physician has requested that you have an echocardiogram. Echocardiography is a painless test that uses sound waves to create images of your heart. It provides your doctor with information about the size and shape of your heart and how well your heart's chambers and valves are working. This procedure takes approximately one hour. There are no restrictions for this procedure. Your physician recommends that you schedule a follow-up appointment in: 6 months. You will receive a reminder letter in the mail in about 4 months reminding you to call and schedule your appointment. If you don't receive this letter, please contact our office. Dr. Myrtis Ser will be contacting your surgeon.

## 2015-08-26 NOTE — Assessment & Plan Note (Signed)
The patient is not having any symptomatic shortness of breath at this time. He does have COPD.

## 2015-08-26 NOTE — Assessment & Plan Note (Signed)
The patient does not have any absolute cardiac contraindications to possible surgery. He certainly is at significant risk with his age, cardiomyopathy, and pulmonary disease. However he has had no clinical CHF. He has not had any arrhythmias or evidence of myocardial ischemia. His overall functional capacity is greater than 4 mets. I've decided to proceed with a 2-D echo to reassess his LV function. I will not give a final opinion until I have this echo data. However, unless there is a significant unexpected change, he will be cleared for possible colon surgery. As I have mentioned he certainly is at increased risk. However there is no absolute contraindication to surgery.

## 2015-08-26 NOTE — Assessment & Plan Note (Signed)
Cardiac catheterization was done in July, 2012. He had only mild disease. It was felt that his left ventricular dysfunction was out of proportion to his coronary disease. No further workup.

## 2015-08-26 NOTE — Assessment & Plan Note (Signed)
The patient has been quite stable with his nonischemic cardiomyopathy. Over time I have chosen not to repeat echoes because he has done so well. In addition, he has had hypotension and all of his heart failure medicines have been stopped over time other than a small dose of Lasix. He does very well. Two-dimensional echo will now be done to reassess LV function to help with the preoperative assessment.

## 2015-09-02 ENCOUNTER — Other Ambulatory Visit: Payer: Medicare Other

## 2015-11-12 DEATH — deceased
